# Patient Record
Sex: Female | Born: 1965 | Race: White | Hispanic: No | Marital: Married | Smoking: Never smoker
Health system: Southern US, Community
[De-identification: ages and names within clinical notes are randomized; demographics above are authoritative.]

## PROBLEM LIST (undated history)

## (undated) DIAGNOSIS — M1990 Unspecified osteoarthritis, unspecified site: Secondary | ICD-10-CM

## (undated) DIAGNOSIS — K5909 Other constipation: Secondary | ICD-10-CM

## (undated) DIAGNOSIS — N840 Polyp of corpus uteri: Secondary | ICD-10-CM

## (undated) DIAGNOSIS — N920 Excessive and frequent menstruation with regular cycle: Secondary | ICD-10-CM

---

## 1998-09-16 ENCOUNTER — Other Ambulatory Visit: Admission: RE | Admit: 1998-09-16 | Discharge: 1998-09-16 | Payer: Self-pay | Admitting: Obstetrics and Gynecology

## 1998-12-22 ENCOUNTER — Other Ambulatory Visit: Admission: RE | Admit: 1998-12-22 | Discharge: 1998-12-22 | Payer: Self-pay | Admitting: Obstetrics and Gynecology

## 2000-02-21 ENCOUNTER — Other Ambulatory Visit: Admission: RE | Admit: 2000-02-21 | Discharge: 2000-02-21 | Payer: Self-pay | Admitting: Obstetrics and Gynecology

## 2001-08-13 ENCOUNTER — Other Ambulatory Visit: Admission: RE | Admit: 2001-08-13 | Discharge: 2001-08-13 | Payer: Self-pay | Admitting: Obstetrics & Gynecology

## 2002-02-05 ENCOUNTER — Other Ambulatory Visit: Admission: RE | Admit: 2002-02-05 | Discharge: 2002-02-05 | Payer: Self-pay | Admitting: Obstetrics and Gynecology

## 2002-02-26 ENCOUNTER — Ambulatory Visit (HOSPITAL_COMMUNITY): Admission: RE | Admit: 2002-02-26 | Discharge: 2002-02-26 | Payer: Self-pay | Admitting: Obstetrics and Gynecology

## 2002-06-02 ENCOUNTER — Ambulatory Visit (HOSPITAL_COMMUNITY): Admission: RE | Admit: 2002-06-02 | Discharge: 2002-06-02 | Payer: Self-pay | Admitting: Obstetrics and Gynecology

## 2002-08-06 ENCOUNTER — Inpatient Hospital Stay (HOSPITAL_COMMUNITY): Admission: AD | Admit: 2002-08-06 | Discharge: 2002-08-07 | Payer: Self-pay | Admitting: Obstetrics and Gynecology

## 2002-09-25 ENCOUNTER — Other Ambulatory Visit: Admission: RE | Admit: 2002-09-25 | Discharge: 2002-09-25 | Payer: Self-pay | Admitting: Obstetrics and Gynecology

## 2003-12-22 ENCOUNTER — Other Ambulatory Visit: Admission: RE | Admit: 2003-12-22 | Discharge: 2003-12-22 | Payer: Self-pay | Admitting: Obstetrics and Gynecology

## 2005-08-09 ENCOUNTER — Other Ambulatory Visit: Admission: RE | Admit: 2005-08-09 | Discharge: 2005-08-09 | Payer: Self-pay | Admitting: Obstetrics and Gynecology

## 2007-10-25 ENCOUNTER — Ambulatory Visit (HOSPITAL_COMMUNITY): Admission: RE | Admit: 2007-10-25 | Discharge: 2007-10-25 | Payer: Self-pay | Admitting: Anesthesiology

## 2009-03-04 ENCOUNTER — Encounter: Admission: RE | Admit: 2009-03-04 | Discharge: 2009-03-04 | Payer: Self-pay | Admitting: Obstetrics and Gynecology

## 2010-08-23 IMAGING — MG MM DIAGNOSTIC UNILATERAL R
3 series · 3 of 3 positions shown · non-contrast
Comparison: September 24, 2007

CLINICAL DATA: Further evaluation of right asymmetry

DIGITAL DIAGNOSTIC  RIGHT  MAMMOGRAM   AND RIGHT BREAST ULTRASOUND:

[R CC (1 of 2)]
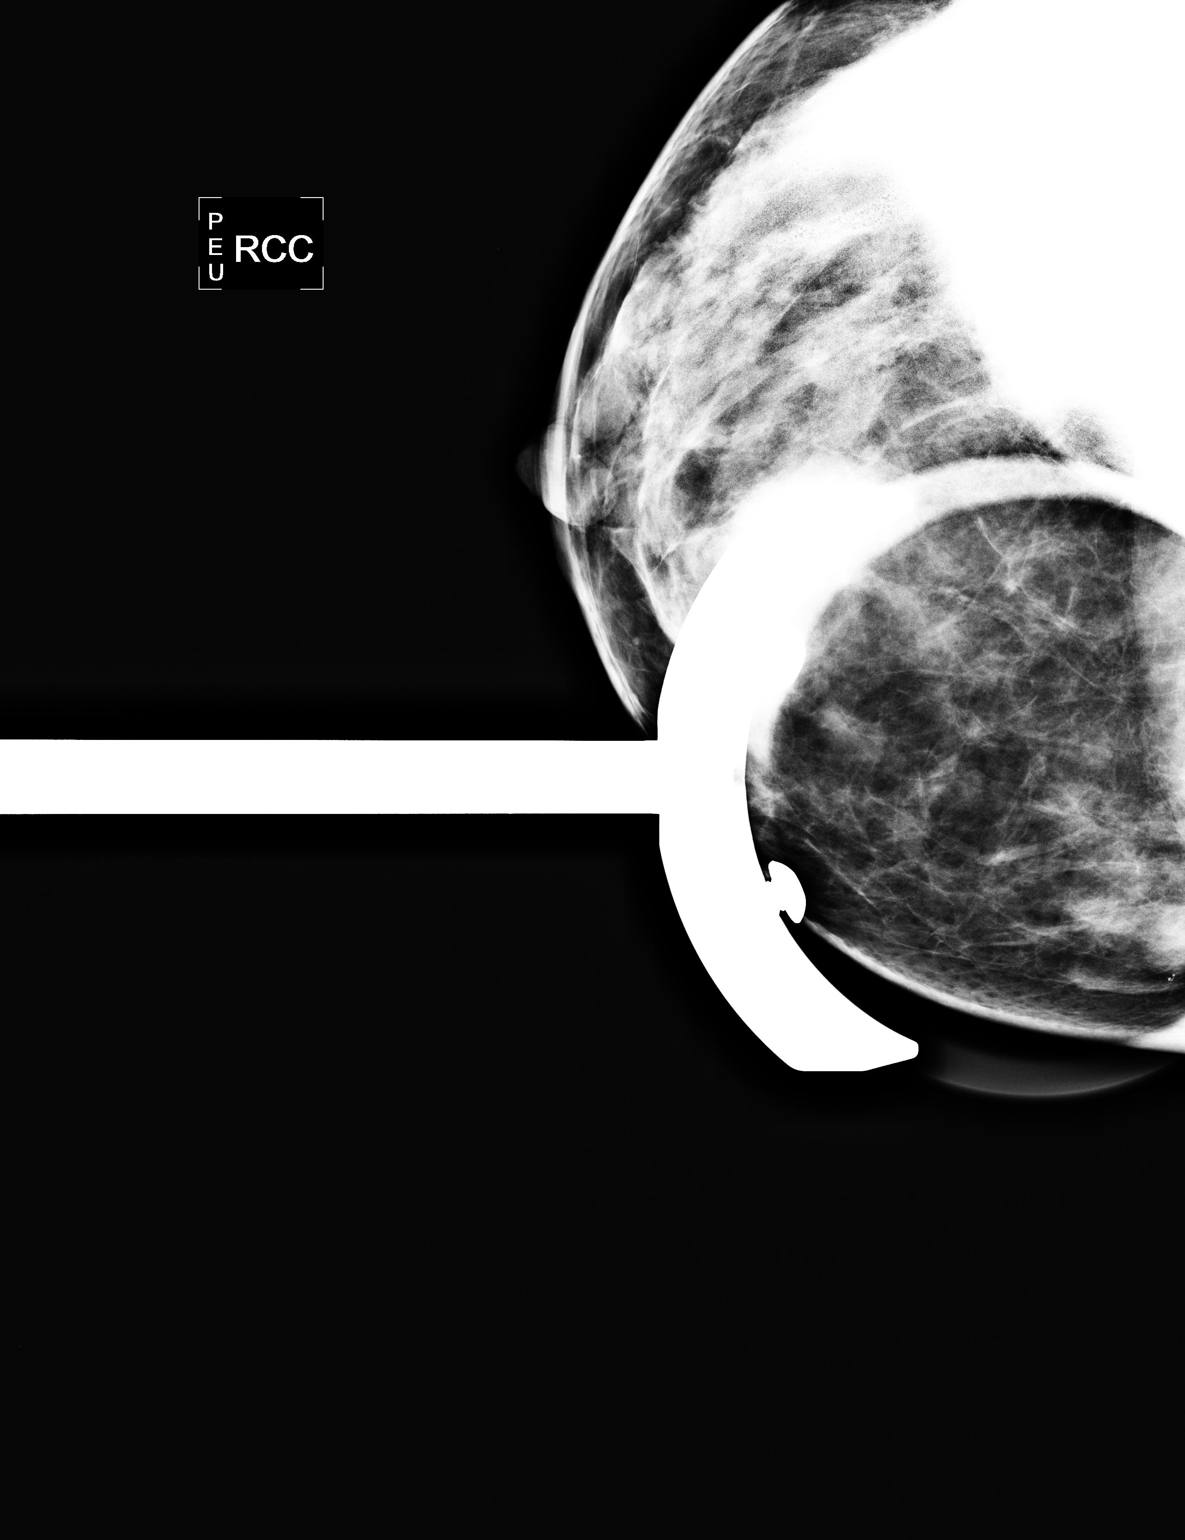

[R MLO]
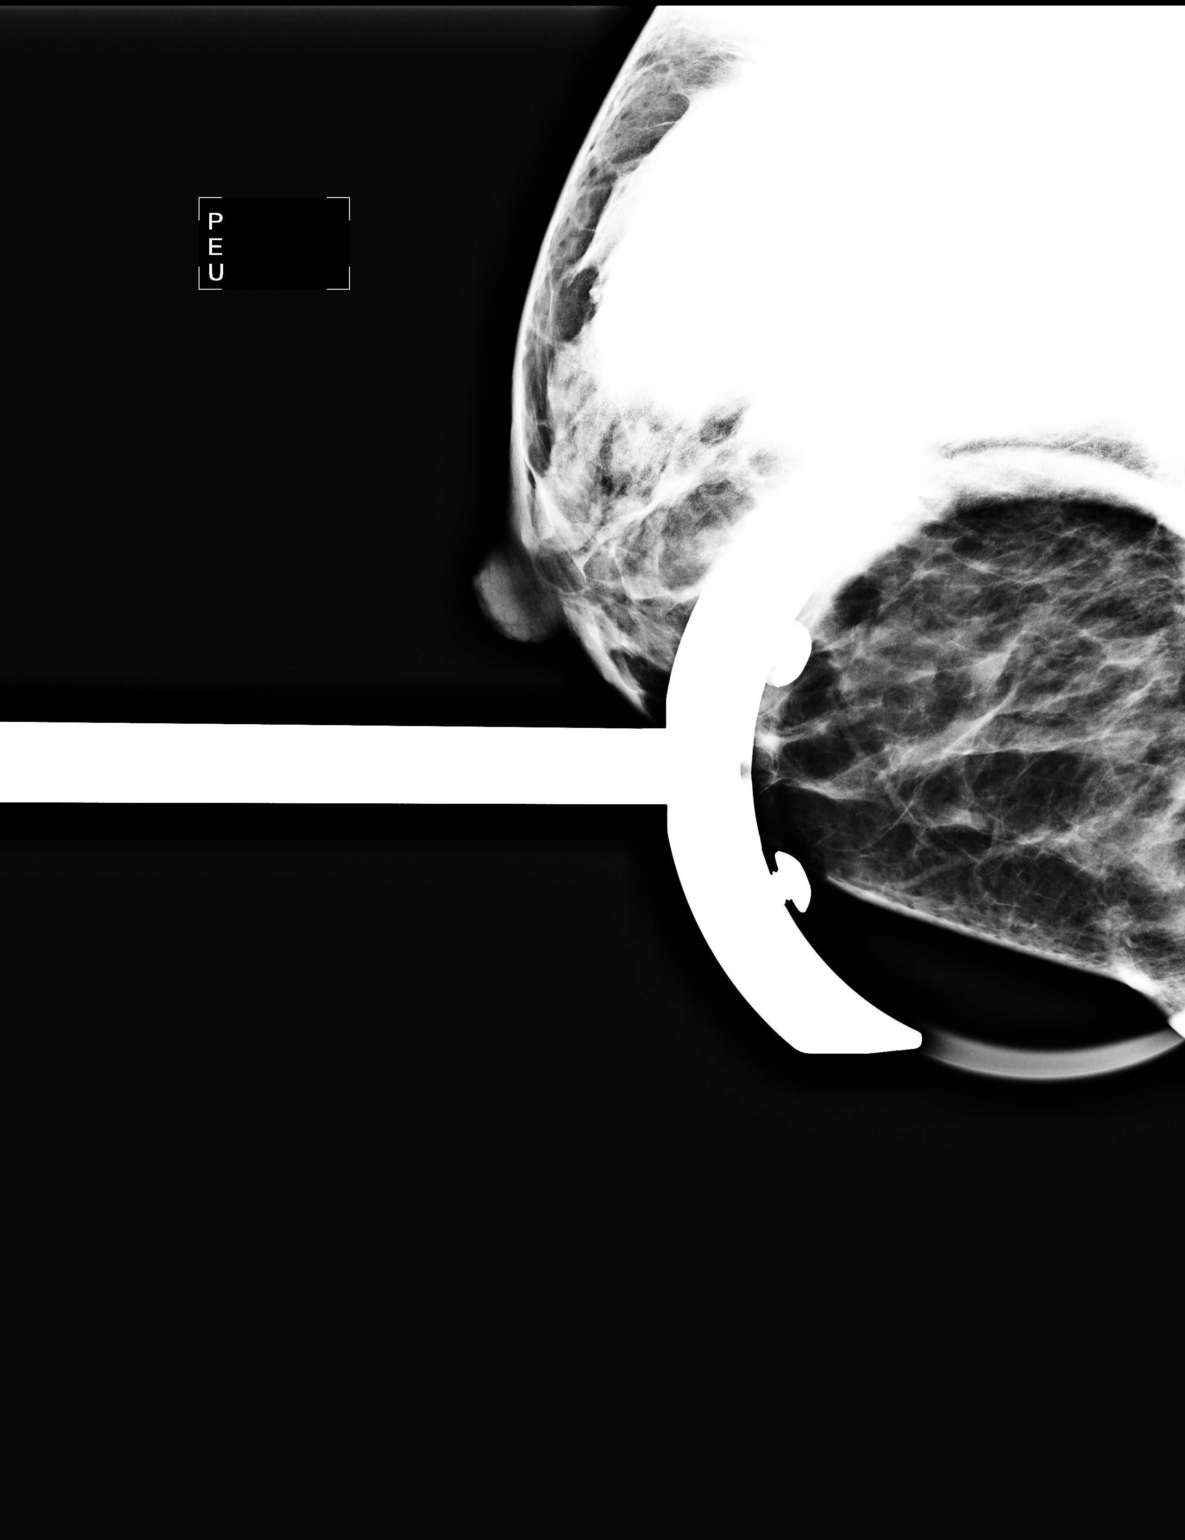

[R CC (2 of 2)]
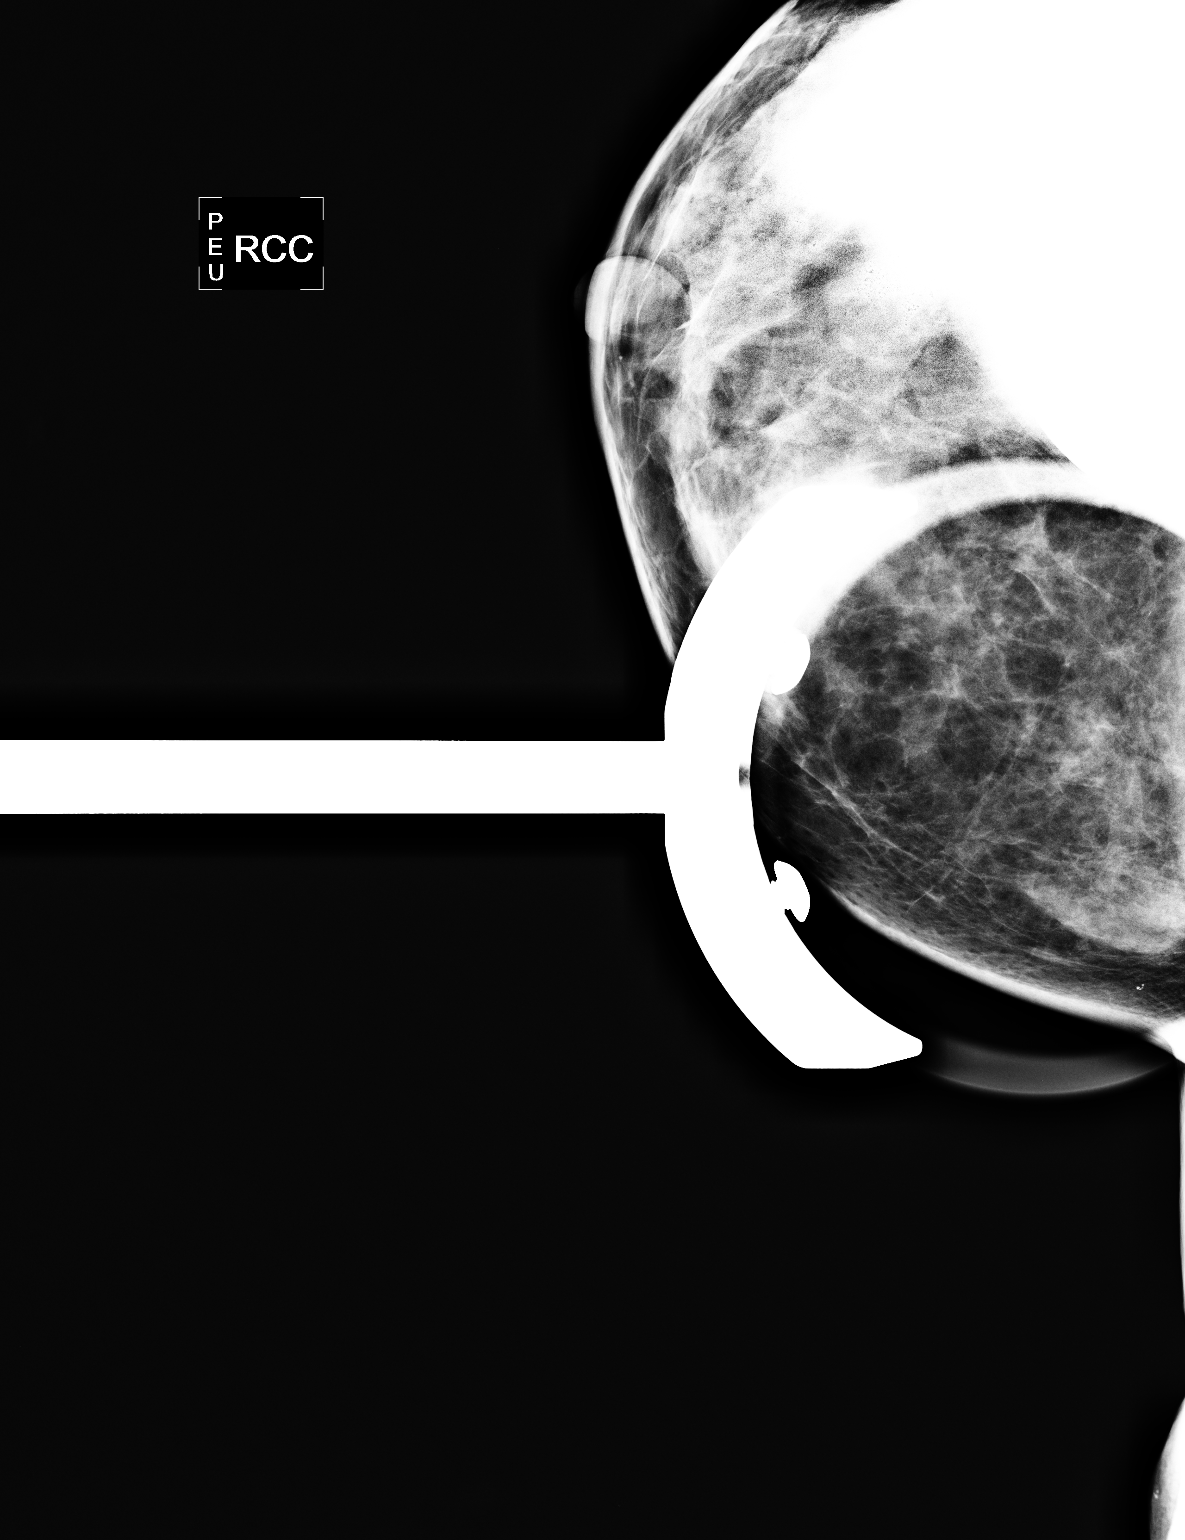

[3 of 3 positions shown; findings below may reference images not displayed]

FINDINGS: Spot compression views show that the area of the medial
inferior asymmetry appears most compatible with an island of normal
fibroglandular tissue.  It is difficult to include this far medial
tissue in its entirety on mammographic images.

On physical exam, there are no suspicious abnormalities in the
medial right breast

Ultrasound is performed, showing no abnormalities in the right
breast medially, from 12 to 6 o'clock position.  There is normal
fibroglandular tissue in this area, however.
IMPRESSION: No abnormalities.  The area in question represents a far medial
island of fibroglandular tissue.

BI-RADS CATEGORY 1:  Negative.

Recommendation:

Maintain annual screening mammography.

## 2014-06-05 ENCOUNTER — Ambulatory Visit: Payer: BLUE CROSS/BLUE SHIELD | Attending: Specialist | Admitting: Physical Therapy

## 2014-06-05 DIAGNOSIS — M7502 Adhesive capsulitis of left shoulder: Secondary | ICD-10-CM | POA: Diagnosis not present

## 2014-06-05 DIAGNOSIS — M25512 Pain in left shoulder: Secondary | ICD-10-CM | POA: Insufficient documentation

## 2014-07-28 ENCOUNTER — Ambulatory Visit: Payer: BLUE CROSS/BLUE SHIELD | Attending: Specialist | Admitting: Physical Therapy

## 2014-07-28 ENCOUNTER — Encounter: Payer: Self-pay | Admitting: Physical Therapy

## 2014-07-28 DIAGNOSIS — M7502 Adhesive capsulitis of left shoulder: Secondary | ICD-10-CM | POA: Diagnosis not present

## 2014-07-28 DIAGNOSIS — M25512 Pain in left shoulder: Secondary | ICD-10-CM | POA: Insufficient documentation

## 2014-07-28 DIAGNOSIS — M25612 Stiffness of left shoulder, not elsewhere classified: Secondary | ICD-10-CM

## 2014-07-28 NOTE — Therapy (Signed)
[facility] [address] [place], [place], [address] Phone: [phone]   Fax:  [phone]  Physical Therapy Treatment  Patient Details  Name: Caitlin Moreno MRN: [id number] Date of Birth: 1965-10-30 Referring Provider:  Eugenia Mcalpineollins, Robert, MD  Encounter Date: 07/28/2014      PT End of Session - 07/28/14 0852    Visit Number 2   PT Start Time 0800   PT Stop Time 0900   PT Time Calculation (min) 60 min      History reviewed. No pertinent past medical history.  History reviewed. No pertinent past surgical history.  There were no vitals taken for this visit.  Visit Diagnosis:  Shoulder stiffness, left  Left shoulder pain      Subjective Assessment - 07/28/14 0805    Symptoms Left shoulder pain and decreased ROM, reports that the exercises are really helping her gain ROM   Pertinent History none   Limitations Lifting   Patient Stated Goals no pain and better ROM   Currently in Pain? No/denies   Aggravating Factors  stretching really helps but is painful   Pain Relieving Factors stretches doing better          [facility] - 07/28/14 0001    AROM   Left Shoulder Flexion 135 Degrees   Left Shoulder ABduction 135 Degrees   Left Shoulder Internal Rotation 45 Degrees   Left Shoulder External Rotation 50 Degrees                  [facility] Adult PT Treatment/Exercise - 07/28/14 0001    Neck Exercises: Machines for Strengthening   UBE (Upper Arm Bike) Level 4 x 4 minutes   Cybex Row 20#   Other Machines for Strengthening Lats 20#   Shoulder Exercises: ROM/Strengthening   "W" Arms 3#   X to V Arms 3#   Ball on Wall rolling up   Manual Therapy   Passive ROM PROM of the left shoulder all motions, some contract relax                     PT Long Term Goals - 07/28/14 0853    PT LONG TERM GOAL #1   Title increase AROM of left shoulder flexion to 150, ER to 60, IR to 65 degrees   Time 6   Period Weeks   Status New   PT LONG TERM GOAL #2   Title no pain with dressing and doing hair   Time 6   Period Weeks   Status New               Plan - 07/28/14 13240852    Clinical Impression Statement Much improved from evaluation, less gaurding and pain, still some limited ROM   Pt will benefit from skilled therapeutic intervention in order to improve on the following deficits Impaired flexibility;Decreased range of motion   Rehab Potential Good   PT Frequency 1x / week   PT Duration 4 weeks   PT Treatment/Interventions Electrical Stimulation;Therapeutic exercise;Manual techniques;Patient/family education   PT Next Visit Plan may hold x 2 weeks as she has done well on own   Consulted and Agree with Plan of Care Patient        Problem List There are no active problems to display for this patient.   Jearld LeschALBRIGHT,Noach Calvillo W, PT 07/28/2014, 8:55 AM  [facility] [phone]  [place], [place], [address] Phone: [phone]   Fax:  [phone]

## 2016-04-25 ENCOUNTER — Ambulatory Visit: Payer: BLUE CROSS/BLUE SHIELD | Attending: Specialist | Admitting: Physical Therapy

## 2016-04-25 ENCOUNTER — Encounter: Payer: Self-pay | Admitting: Physical Therapy

## 2016-04-25 DIAGNOSIS — G8929 Other chronic pain: Secondary | ICD-10-CM | POA: Diagnosis present

## 2016-04-25 DIAGNOSIS — M25511 Pain in right shoulder: Secondary | ICD-10-CM | POA: Diagnosis not present

## 2016-04-25 DIAGNOSIS — M25611 Stiffness of right shoulder, not elsewhere classified: Secondary | ICD-10-CM | POA: Diagnosis present

## 2016-04-25 NOTE — Therapy (Signed)
[facility] [address] [place], [place], [address] Phone: [phone]   Fax:  [phone]  Physical Therapy Evaluation  Patient Details  Name: Caitlin PeerStacey L Moreno MRN: [id number] Date of Birth: 03-12-1966 Referring Provider: Dr. Thomasena Edisollins  Encounter Date: 04/25/2016      PT End of Session - 04/25/16 1527    Visit Number 1   Date for PT Re-Evaluation 06/20/16   PT Start Time 0245   PT Stop Time 0340   PT Time Calculation (min) 55 min   Activity Tolerance Patient limited by pain   Behavior During Therapy [facility] for tasks assessed/performed      History reviewed. No pertinent past medical history.  History reviewed. No pertinent surgical history.  There were no vitals filed for this visit.       Subjective Assessment - 04/25/16 1441    Limitations Lifting;House hold activities;Other (comment)  tennis, golf   Diagnostic tests MRI   Patient Stated Goals achieve full AROM with right arm, play tennis   Currently in Pain? Yes   Pain Score 0-No pain  9/10: worst (reaching overhead)   Pain Location Shoulder   Pain Orientation Right;Anterior;Posterior;Medial   Pain Descriptors / Indicators Radiating;Aching   Pain Type Chronic pain   Pain Onset More than a month ago   Aggravating Factors  reaching overhead, activities around the house   Pain Relieving Factors resting, after a minute of 9/10 pain, the pain subsides on its own   Effect of Pain on Daily Activities unable to use right arm effectively            [facility] - 04/25/16 0001      Assessment   Medical Diagnosis RC tear rt shoulder   Referring Provider Dr. Thomasena Edisollins   Hand Dominance Right   Next MD Visit tomorrow   Prior Therapy yes  previous episode     Balance Screen   Has the patient fallen in the past 6 months Yes     ROM / Strength   AROM / PROM / Strength AROM;PROM;Strength     AROM   AROM Assessment Site Shoulder   Right/Left  Shoulder Right   Right Shoulder Flexion 93 Degrees   Right Shoulder ABduction 60 Degrees   Right Shoulder Internal Rotation 20 Degrees   Right Shoulder External Rotation 10 Degrees     PROM   PROM Assessment Site Shoulder   Right/Left Shoulder Right   Right Shoulder Flexion 108 Degrees   Right Shoulder ABduction 70 Degrees   Right Shoulder Internal Rotation 30 Degrees   Right Shoulder External Rotation 40 Degrees     Strength   Overall Strength Comments right shoulder ER 4-/5, IR 3+/5, flexion and abduction 3/5 due to pain and inability to lift arm above 90 degrees     Palpation   Palpation comment TTP posterior shoulder region (teres minor, infraspinatus)                   [facility] Adult PT Treatment/Exercise - 04/25/16 0001      Modalities   Modalities Electrical Stimulation;Moist Heat     Moist Heat Therapy   Number Minutes Moist Heat 15 Minutes   Moist Heat Location Shoulder     Electrical Stimulation   Electrical Stimulation Location rt shoulder   Electrical Stimulation Goals Pain;Strength     Manual Therapy   Manual Therapy Joint mobilization;Passive ROM   Joint Mobilization AP jt mobilzation rt shoulder  Passive ROM flexion, abduction                PT Education - 04/25/16 1518    Education provided Yes   Education Details HEP: self shoulder mobilzation, table slide, shoulder isometrics   Person(s) Educated Patient   Methods Explanation;Demonstration;Handout;Tactile cues;Verbal cues   Comprehension Verbalized understanding;Returned demonstration          PT Short Term Goals - 04/25/16 1550      PT SHORT TERM GOAL #1   Title Pt will demonstrate proper recall of HEP.   Time 1   Period Weeks   Status New           PT Long Term Goals - 04/25/16 1550      PT LONG TERM GOAL #1   Title Pt will be able to reach 120 degrees flexion, 100 degrees of abduction, 70 degrees IR, and 50 degrees ER.   Time 8   Period Weeks   Status New      PT LONG TERM GOAL #2   Title Pt will report 50% decrease in pain with overhead activities. (currently 9/10)   Time 8   Period Weeks   Status New     PT LONG TERM GOAL #3   Title Pt will achieve 4/5 strength with Shoulder flexion and abduction.    Time 8   Period Weeks   Status New               Plan - 04/25/16 1545    Clinical Impression Statement Patient presents with limited AROM and PROM in the right shoulder due to medical diagnosis of RC tear and labral tear. Pt is able to move the arm against gravity however MMT was not perfromed due to pain with AROM and PROM. Pt states that the primary site of pain is in the posterior shoulder around the attachment sites of teres minor and infraspinatus. Pt toelrated jont mobilzation fair however she is very guarded with PROM.    Rehab Potential Fair   PT Frequency 2x / week   PT Duration 8 weeks   PT Treatment/Interventions Cryotherapy;ADLs/Self Care Home Management;Electrical Stimulation;Moist Heat;Functional mobility training;Therapeutic activities;Therapeutic exercise;Patient/family education;Passive range of motion;Manual techniques;Dry needling;Ultrasound   PT Next Visit Plan AAROM, PROM, joint mobilzation, isometrics, pain management   PT Home Exercise Plan stretches, isometrics,    Consulted and Agree with Plan of Care Patient    I left a message with the MD office secondary to the MD order reporting a complete tear and the patient reporting to us that the MD told her it was a partial tear.  Patient will benefit from skilled therapeutic intervention in order to improve the following deficits and impairments:  Decreased activity tolerance, Decreased mobility, Decreased range of motion, Decreased strength, Hypomobility, Pain, Impaired flexibility  Visit Diagnosis: Chronic right shoulder pain - Plan: PT plan of care cert/re-cert  Stiffness of right shoulder, not elsewhere classified - Plan: PT plan of care  cert/re-cert     Problem List There are no active problems to display for this patient.   Jearld LeschALBRIGHT,Danielle Lento W., PT 04/25/2016, 4:19 PM  [facility] [address] [place], [place], [address] Phone: [phone]   Fax:  [phone]  Name: Caitlin Moreno MRN: [id number] Date of Birth: 1965-06-28

## 2016-05-01 ENCOUNTER — Ambulatory Visit: Payer: BLUE CROSS/BLUE SHIELD | Admitting: Physical Therapy

## 2016-05-04 ENCOUNTER — Ambulatory Visit: Payer: BLUE CROSS/BLUE SHIELD | Attending: Specialist | Admitting: Physical Therapy

## 2016-05-04 ENCOUNTER — Encounter: Payer: Self-pay | Admitting: Physical Therapy

## 2016-05-04 DIAGNOSIS — M25511 Pain in right shoulder: Secondary | ICD-10-CM | POA: Insufficient documentation

## 2016-05-04 DIAGNOSIS — M25611 Stiffness of right shoulder, not elsewhere classified: Secondary | ICD-10-CM | POA: Insufficient documentation

## 2016-05-04 DIAGNOSIS — G8929 Other chronic pain: Secondary | ICD-10-CM | POA: Diagnosis present

## 2016-05-04 DIAGNOSIS — M62838 Other muscle spasm: Secondary | ICD-10-CM | POA: Insufficient documentation

## 2016-05-04 NOTE — Therapy (Signed)
[facility] [address] [place], [place], [address] Phone: [phone]   Fax:  [phone]  Physical Therapy Treatment  Patient Details  Name: Caitlin Moreno MRN: [id number] Date of Birth: 04-30-1966 Referring Provider: Dr. Thomasena Edisollins  Encounter Date: 05/04/2016      PT End of Session - 05/04/16 1554    Visit Number 2   Date for PT Re-Evaluation 06/20/16   PT Start Time 0245   PT Stop Time 0340   PT Time Calculation (min) 55 min   Activity Tolerance Patient tolerated treatment well   Behavior During Therapy [facility] for tasks assessed/performed      History reviewed. No pertinent past medical history.  History reviewed. No pertinent surgical history.  There were no vitals filed for this visit.      Subjective Assessment - 05/04/16 1450    Subjective Pt states she is doing "maybe a little better" however she does not feel like her AROM is any better. At worst, pain is a 7/10.   Limitations Lifting;House hold activities;Other (comment)   Diagnostic tests MRI   Currently in Pain? No/denies   Pain Location Shoulder   Pain Orientation Right   Multiple Pain Sites No            [facility] PT Assessment - 05/04/16 0001      AROM   AROM Assessment Site Shoulder   Right/Left Shoulder Right   Right Shoulder Flexion 110 Degrees   Right Shoulder ABduction 70 Degrees                     [facility] Adult PT Treatment/Exercise - 05/04/16 0001      Exercises   Exercises Shoulder     Shoulder Exercises: Stretch   Table Stretch - Flexion 5 reps;10 seconds   Table Stretch - Abduction 5 reps;10 seconds   Other Shoulder Stretches ladder climb 5x hold 10 secs   Other Shoulder Stretches wall stretches (lateral, scaption, flexion, adduction)     Modalities   Modalities Moist Heat;Electrical Stimulation     Moist Heat Therapy   Number Minutes Moist Heat 10 Minutes   Moist Heat Location Shoulder     Electrical  Stimulation   Electrical Stimulation Location rt shoulder   Electrical Stimulation Action IFC   Electrical Stimulation Goals Pain     Manual Therapy   Manual Therapy Joint mobilization;Passive ROM   Joint Mobilization AP grade 2 jt mobilization in different degrees of abduction   Passive ROM Flexion, abduction, ER, IR                PT Education - 05/04/16 1553    Education provided Yes   Education Details Educated pt on difference btw acupuncture and dry needling. Spoke with pt about continuing the HEP stretches   Person(s) Educated Patient   Methods Explanation   Comprehension Verbalized understanding          PT Short Term Goals - 05/04/16 1601      PT SHORT TERM GOAL #1   Status Achieved           PT Long Term Goals - 04/25/16 1550      PT LONG TERM GOAL #1   Title Pt will be able to reach 120 degrees flexion, 100 degrees of abduction, 70 degrees IR, and 50 degrees ER.   Time 8   Period Weeks   Status New     PT LONG TERM GOAL #  2   Title Pt will report 50% decrease in pain with overhead activities. (currently 9/10)   Time 8   Period Weeks   Status New     PT LONG TERM GOAL #3   Title Pt will achieve 4/5 strength with Shoulder flexion and abduction.    Time 8   Period Weeks   Status New               Plan - 05/04/16 1555    Clinical Impression Statement Pt toelrated treatment fair and was able to complete all exercsies. Pt does express discomfort with PROM and AROM exercises however she is able to continue and tolerate the discomfort. Range of motion was main focus today and we initiated treatment with heat, PROM and joint mobilization. Pt reports positive feedback from treatment session. Pt very persistent on dry needling and trying dry needling intervention in order to help loosen up the shoulder. Pt was educated on dry needling and was advised that if the decreased ROM was an adhesive capsulitis issue, dry needling may not help. However if  the tightness has been caused by a muscular issue or a combination of adhesive capsulitis and muscular then dry needling may be beneficial. Pt understood that dry needling is not guaranteed to help and agreed to try it. Pt has improved active flexion by 17 degrees and active abduction by 10 degrees since the initial eval.   Rehab Potential Fair   PT Frequency 2x / week   PT Duration 8 weeks   PT Treatment/Interventions Cryotherapy;ADLs/Self Care Home Management;Electrical Stimulation;Moist Heat;Functional mobility training;Therapeutic activities;Therapeutic exercise;Patient/family education;Passive range of motion;Manual techniques;Dry needling;Ultrasound   PT Next Visit Plan AAROM, PROM, joint mobilzation, isometrics, pain management, dry needling   Consulted and Agree with Plan of Care Patient      Patient will benefit from skilled therapeutic intervention in order to improve the following deficits and impairments:  Decreased activity tolerance, Decreased mobility, Decreased range of motion, Decreased strength, Hypomobility, Pain, Impaired flexibility  Visit Diagnosis: Chronic right shoulder pain  Stiffness of right shoulder, not elsewhere classified  Other muscle spasm     Problem List There are no active problems to display for this patient.   Arsenio LoaderBrian Cade Larch Waysley, SPT 05/04/2016, 4:03 PM  [facility] [address] [place], [place], [address] Phone: [phone]   Fax:  [phone]  Name: Caitlin Moreno MRN: [id number] Date of Birth: Sep 29, 1965

## 2016-05-11 ENCOUNTER — Ambulatory Visit: Payer: BLUE CROSS/BLUE SHIELD | Admitting: Rehabilitative and Restorative Service Providers"

## 2016-05-11 ENCOUNTER — Encounter: Payer: Self-pay | Admitting: Rehabilitative and Restorative Service Providers"

## 2016-05-11 DIAGNOSIS — M25511 Pain in right shoulder: Secondary | ICD-10-CM | POA: Diagnosis not present

## 2016-05-11 DIAGNOSIS — M25611 Stiffness of right shoulder, not elsewhere classified: Secondary | ICD-10-CM

## 2016-05-11 DIAGNOSIS — G8929 Other chronic pain: Secondary | ICD-10-CM

## 2016-05-11 DIAGNOSIS — M62838 Other muscle spasm: Secondary | ICD-10-CM

## 2016-05-11 NOTE — Patient Instructions (Signed)
Trigger Point Dry Needling  . What is Trigger Point Dry Needling (DN)? o DN is a physical therapy technique used to treat muscle pain and dysfunction. Specifically, DN helps deactivate muscle trigger points (muscle knots).  o A thin filiform needle is used to penetrate the skin and stimulate the underlying trigger point. The goal is for a local twitch response (LTR) to occur and for the trigger point to relax. No medication of any kind is injected during the procedure.   . What Does Trigger Point Dry Needling Feel Like?  o The procedure feels different for each individual patient. Some patients report that they do not actually feel the needle enter the skin and overall the process is not painful. Very mild bleeding may occur. However, many patients feel a deep cramping in the muscle in which the needle was inserted. This is the local twitch response.   . How Will I feel after the treatment? o Soreness is normal, and the onset of soreness may not occur for a few hours. Typically this soreness does not last longer than two days.  o Bruising is uncommon, however; ice can be used to decrease any possible bruising.  o In rare cases feeling tired or nauseous after the treatment is normal. In addition, your symptoms may get worse before they get better, this period will typically not last longer than 24 hours.   . What Can I do After My Treatment? o Increase your hydration by drinking more water for the next 24 hours. o You may place ice or heat on the areas treated that have become sore, however, do not use heat on inflamed or bruised areas. Heat often brings more relief post needling. o You can continue your regular activities, but vigorous activity is not recommended initially after the treatment for 24 hours. o DN is best combined with other physical therapy such as strengthening, stretching, and other therapies.    TENS UNIT: This is helpful for muscle pain and spasm.   Search and Purchase a  TENS 7000 2nd edition at www.tenspros.com. It should be less than $30.     TENS unit instructions: Do not shower or bathe with the unit on Turn the unit off before removing electrodes or batteries If the electrodes lose stickiness add a drop of water to the electrodes after they are disconnected from the unit and place on plastic sheet. If you continued to have difficulty, call the TENS unit company to purchase more electrodes. Do not apply lotion on the skin area prior to use. Make sure the skin is clean and dry as this will help prolong the life of the electrodes. After use, always check skin for unusual red areas, rash or other skin difficulties. If there are any skin problems, does not apply electrodes to the same area. Never remove the electrodes from the unit by pulling the wires. Do not use the TENS unit or electrodes other than as directed. Do not change electrode placement without consultating your therapist or physician. Keep 2 fingers with between each electrode.    

## 2016-05-11 NOTE — Therapy (Signed)
[facility] [place] [address]  [address] [place], [place], [address] Phone: [phone]   Fax:  [phone]  Physical Therapy Treatment  Patient Details  Name: Caitlin Moreno MRN: [id number] Date of Birth: 12-25-1965 Referring Provider: Dr. Thomasena Edisollins  Encounter Date: 05/11/2016      PT End of Session - 05/11/16 1434    Visit Number 3   Date for PT Re-Evaluation 06/20/16   PT Start Time 1434   PT Stop Time 1530   PT Time Calculation (min) 56 min   Activity Tolerance Patient tolerated treatment well      History reviewed. No pertinent past medical history.  History reviewed. No pertinent surgical history.  There were no vitals filed for this visit.      Subjective Assessment - 05/11/16 1435    Subjective (P)  Patient states that she is anxious to try the dry needling    Currently in Pain? (P)  No/denies                         [facility] Adult PT Treatment/Exercise - 05/11/16 0001      Shoulder Exercises: Standing   Other Standing Exercises shd elevation x 5     Moist Heat Therapy   Number Minutes Moist Heat 20 Minutes   Moist Heat Location Shoulder  Rt     Electrical Stimulation   Electrical Stimulation Location Rt shoulder girdle    Electrical Stimulation Action IFC   Electrical Stimulation Parameters to tolerance   Electrical Stimulation Goals Pain;Tone     Manual Therapy   Manual therapy comments Pt prone and supine    Joint Mobilization GH mobs Grade 2 - PA/AP/inferior; circumduction    Soft tissue mobilization teres/lats/lateral scapualr border; medial and inferior scapular borders; upper trap; leveator; thoracic paraspinals   Myofascial Release upper trap; scapular    Scapular Mobilization Rt scap multiple planes    Passive ROM pt supine - PROM shd flexion; abduction; ER; extension(shd off edge of table)           Trigger Point Dry Needling - 05/11/16 1532    Consent Given? Yes   Education Handout Provided Yes   Muscles Treated Upper Body Upper trapezius;Levator scapulae;Rhomboids;Supraspinatus;Infraspinatus;Subscapularis;Longissimus  Rt upper quarter    Upper Trapezius Response Twitch reponse elicited;Palpable increased muscle length   Levator Scapulae Response Twitch response elicited;Palpable increased muscle length   Rhomboids Response Twitch response elicited;Palpable increased muscle length   Supraspinatus Response Twitch response elicited;Palpable increased muscle length   Infraspinatus Response Twitch response elicited;Palpable increased muscle length   Subscapularis Response Twitch response elicited;Palpable increased muscle length   Longissimus Response Twitch response elicited;Palpable increased muscle length  thoracic              PT Education - 05/11/16 1526    Education provided Yes   Education Details TDN; TENS info    Person(s) Educated Patient   Methods Explanation;Demonstration;Tactile cues;Verbal cues;Handout   Comprehension Verbalized understanding;Returned demonstration;Verbal cues required;Tactile cues required          PT Short Term Goals - 05/04/16 1601      PT SHORT TERM GOAL #1   Status Achieved           PT Long Term Goals - 05/11/16 1435      PT LONG TERM GOAL #1   Title Pt will be able to reach 120 degrees flexion, 100 degrees of abduction, 70 degrees IR, and  50 degrees ER.   Time 8   Period Weeks   Status On-going     PT LONG TERM GOAL #2   Title Pt will report 50% decrease in pain with overhead activities. (currently 9/10)   Time 8   Period Weeks   Status On-going     PT LONG TERM GOAL #3   Title Pt will achieve 4/5 strength with Shoulder flexion and abduction.    Time 8   Period Weeks   Status On-going               Plan - 05/11/16 1534    Clinical Impression Statement Patient reports improvement with PT and HEP. She feels she has had/will have positive benefits form TDN and is interested  in continuing - even though it hurts. Patient tolerated TDN well with good improvement in tissue extensibility noted following treatment. Continued significant joint restrictions Rt GH joint; poor scapulohumeral movement patterns; limited ROM. Good improvement in ROM since initial evaluation.    Rehab Potential Fair   PT Frequency 2x / week   PT Duration 8 weeks   PT Treatment/Interventions Cryotherapy;ADLs/Self Care Home Management;Electrical Stimulation;Moist Heat;Functional mobility training;Therapeutic activities;Therapeutic exercise;Patient/family education;Passive range of motion;Manual techniques;Dry needling;Ultrasound   PT Next Visit Plan AAROM, PROM, joint mobilzation, isometrics, pain management, assess response to dry needling; consider TENS for pain management   Consulted and Agree with Plan of Care Patient      Patient will benefit from skilled therapeutic intervention in order to improve the following deficits and impairments:  Decreased activity tolerance, Decreased mobility, Decreased range of motion, Decreased strength, Hypomobility, Pain, Impaired flexibility  Visit Diagnosis: Chronic right shoulder pain  Stiffness of right shoulder, not elsewhere classified  Other muscle spasm     Problem List There are no active problems to display for this patient.   Oaklynn Stierwalt Rober MinionP Charlita Brian PT, MPH  05/11/2016, 3:38 PM  [facility] [place] [address]  [address] [place], [place], [address] Phone: [phone]   Fax:  [phone]  Name: Caitlin Moreno MRN: [id number] Date of Birth: April 15, 1966

## 2016-07-06 ENCOUNTER — Encounter (HOSPITAL_BASED_OUTPATIENT_CLINIC_OR_DEPARTMENT_OTHER): Payer: Self-pay | Admitting: *Deleted

## 2016-07-06 NOTE — Progress Notes (Signed)
NPO AFTER MN.  ARRIVE AT 0600.  NEEDS HG AND URINE PREG.  

## 2016-07-09 NOTE — H&P (Signed)
Caitlin Moreno is an 51 y.o. female with heavy menstrual bleeding and endometrial polyp.  Sonohysterogram showed 17mm endometrial polyp.  She also desires sterility.  Pertinent Gynecological History: Menses: flow is excessive with use of 7 pads or tampons on heaviest days Bleeding: dysfunctional uterine bleeding Contraception: condoms DES exposure: unknown Blood transfusions: none Sexually transmitted diseases: no past history Previous GYN Procedures: none  Last mammogram: normal Date: 06/2016 Last pap: normal Date: 06/2016 OB History: G3, P3   Menstrual History: Menarche age: n/a Patient's last menstrual period was 06/29/2016 (approximate).    Past Medical History:  Diagnosis Date  . Arthritis    lower back and neck  . Chronic constipation   . Endometrial polyp   . Heavy menstrual bleeding     History reviewed. No pertinent surgical history.  History reviewed. No pertinent family history.  Social History:  reports that she has never smoked. She has never used smokeless tobacco. She reports that she does not drink alcohol or use drugs.  Allergies:  Allergies  Allergen Reactions  . Betadine [Povidone Iodine] Other (See Comments)    "skin moddled all over body"    No prescriptions prior to admission.    ROS  Height 5' 1.5" (1.562 m), weight 119 lb (54 kg), last menstrual period 06/29/2016. Physical Exam  Constitutional: She is oriented to person, place, and time. She appears well-developed and well-nourished.  GI: Soft. There is no rebound and no guarding.  Neurological: She is alert and oriented to person, place, and time.  Skin: Skin is warm and dry.  Psychiatric: She has a normal mood and affect. Her behavior is normal.    No results found for this or any previous visit (from the past 24 hour(s)).  No results found.  Assessment/Plan: 51yo G3P3 with HMB, endometrial polyp and desire for sterility -L/S BTL, H/S, D&C, myosure, Novasure -Patient counseled re:  risk of bleeding, infection, scarring and damage to surrounding structures.  All questions were answered and the patient wishes to proceed.  Jerritt Cardoza 07/09/2016, 8:09 PM

## 2016-07-10 ENCOUNTER — Encounter (HOSPITAL_BASED_OUTPATIENT_CLINIC_OR_DEPARTMENT_OTHER): Payer: Self-pay

## 2016-07-10 ENCOUNTER — Encounter (HOSPITAL_BASED_OUTPATIENT_CLINIC_OR_DEPARTMENT_OTHER)
Admission: RE | Disposition: A | Discharge status: Home / Self Care | Payer: Self-pay | Source: Ambulatory Visit | Attending: Obstetrics & Gynecology

## 2016-07-10 ENCOUNTER — Ambulatory Visit (HOSPITAL_BASED_OUTPATIENT_CLINIC_OR_DEPARTMENT_OTHER)
Admission: RE | Admit: 2016-07-10 | Discharge: 2016-07-10 | Disposition: A | Discharge status: Home / Self Care | Payer: BLUE CROSS/BLUE SHIELD | Source: Ambulatory Visit | Attending: Obstetrics & Gynecology | Admitting: Obstetrics & Gynecology

## 2016-07-10 ENCOUNTER — Ambulatory Visit (HOSPITAL_BASED_OUTPATIENT_CLINIC_OR_DEPARTMENT_OTHER): Payer: BLUE CROSS/BLUE SHIELD | Admitting: Anesthesiology

## 2016-07-10 ENCOUNTER — Ambulatory Visit (HOSPITAL_BASED_OUTPATIENT_CLINIC_OR_DEPARTMENT_OTHER): Admit: 2016-07-10 | Payer: BLUE CROSS/BLUE SHIELD | Admitting: Obstetrics & Gynecology

## 2016-07-10 DIAGNOSIS — Z302 Encounter for sterilization: Secondary | ICD-10-CM | POA: Insufficient documentation

## 2016-07-10 DIAGNOSIS — Z888 Allergy status to other drugs, medicaments and biological substances status: Secondary | ICD-10-CM | POA: Insufficient documentation

## 2016-07-10 DIAGNOSIS — N840 Polyp of corpus uteri: Secondary | ICD-10-CM | POA: Diagnosis not present

## 2016-07-10 DIAGNOSIS — M479 Spondylosis, unspecified: Secondary | ICD-10-CM | POA: Insufficient documentation

## 2016-07-10 DIAGNOSIS — N920 Excessive and frequent menstruation with regular cycle: Secondary | ICD-10-CM | POA: Insufficient documentation

## 2016-07-10 DIAGNOSIS — K5909 Other constipation: Secondary | ICD-10-CM | POA: Diagnosis not present

## 2016-07-10 HISTORY — PX: DILITATION & CURRETTAGE/HYSTROSCOPY WITH NOVASURE ABLATION: SHX5568

## 2016-07-10 HISTORY — PX: LAPAROSCOPIC TUBAL LIGATION: SHX1937

## 2016-07-10 HISTORY — DX: Unspecified osteoarthritis, unspecified site: M19.90

## 2016-07-10 HISTORY — DX: Polyp of corpus uteri: N84.0

## 2016-07-10 HISTORY — DX: Excessive and frequent menstruation with regular cycle: N92.0

## 2016-07-10 HISTORY — DX: Other constipation: K59.09

## 2016-07-10 LAB — POCT HEMOGLOBIN-HEMACUE: Hemoglobin: 11.6 g/dL — ABNORMAL LOW (ref 12.0–15.0)

## 2016-07-10 LAB — POCT PREGNANCY, URINE: PREG TEST UR: NEGATIVE

## 2016-07-10 SURGERY — DILATATION & CURETTAGE/HYSTEROSCOPY WITH NOVASURE ABLATION
Anesthesia: General | Site: Uterus

## 2016-07-10 SURGERY — DILATATION & CURETTAGE/HYSTEROSCOPY WITH MYOSURE
Anesthesia: Choice

## 2016-07-10 MED ORDER — LACTATED RINGERS IV SOLN
INTRAVENOUS | Status: DC
  Filled 2016-07-10: qty 1000

## 2016-07-10 MED ORDER — LIDOCAINE 2% (20 MG/ML) 5 ML SYRINGE
INTRAMUSCULAR | Status: AC
  Filled 2016-07-10: qty 10

## 2016-07-10 MED ORDER — SODIUM CHLORIDE 0.9 % IJ SOLN
INTRAMUSCULAR | Status: AC
  Filled 2016-07-10: qty 50

## 2016-07-10 MED ORDER — FENTANYL CITRATE (PF) 100 MCG/2ML IJ SOLN
INTRAMUSCULAR | Status: AC
  Filled 2016-07-10: qty 2

## 2016-07-10 MED ORDER — LACTATED RINGERS IR SOLN
Status: DC | PRN
  Administered 2016-07-10: 3000 mL

## 2016-07-10 MED ORDER — LIDOCAINE 2% (20 MG/ML) 5 ML SYRINGE
INTRAMUSCULAR | Status: DC | PRN
  Administered 2016-07-10: 80 mg via INTRAVENOUS

## 2016-07-10 MED ORDER — SCOPOLAMINE 1 MG/3DAYS TD PT72
1.0000 | MEDICATED_PATCH | TRANSDERMAL | Status: DC
  Administered 2016-07-10: 1.5 mg via TRANSDERMAL
  Filled 2016-07-10: qty 1

## 2016-07-10 MED ORDER — ONDANSETRON HCL 4 MG/2ML IJ SOLN
INTRAMUSCULAR | Status: AC
  Filled 2016-07-10: qty 2

## 2016-07-10 MED ORDER — MIDAZOLAM HCL 5 MG/5ML IJ SOLN
INTRAMUSCULAR | Status: DC | PRN
  Administered 2016-07-10: 2 mg via INTRAVENOUS

## 2016-07-10 MED ORDER — MEPERIDINE HCL 25 MG/ML IJ SOLN
6.2500 mg | INTRAMUSCULAR | Status: DC | PRN
  Filled 2016-07-10: qty 1

## 2016-07-10 MED ORDER — ROCURONIUM BROMIDE 10 MG/ML (PF) SYRINGE
PREFILLED_SYRINGE | INTRAVENOUS | Status: DC | PRN
  Administered 2016-07-10: 35 mg via INTRAVENOUS

## 2016-07-10 MED ORDER — MIDAZOLAM HCL 2 MG/2ML IJ SOLN
0.5000 mg | Freq: Once | INTRAMUSCULAR | Status: DC | PRN
  Filled 2016-07-10: qty 2

## 2016-07-10 MED ORDER — KETOROLAC TROMETHAMINE 30 MG/ML IJ SOLN
INTRAMUSCULAR | Status: AC
Start: 2016-07-10 — End: 2016-07-10
  Filled 2016-07-10: qty 2

## 2016-07-10 MED ORDER — ARTIFICIAL TEARS OP OINT
TOPICAL_OINTMENT | OPHTHALMIC | Status: AC
  Filled 2016-07-10: qty 3.5

## 2016-07-10 MED ORDER — LIDOCAINE HCL 1 % IJ SOLN
INTRAMUSCULAR | Status: AC
  Filled 2016-07-10: qty 20

## 2016-07-10 MED ORDER — BUPIVACAINE HCL (PF) 0.25 % IJ SOLN
INTRAMUSCULAR | Status: DC | PRN
  Administered 2016-07-10: 2 mL

## 2016-07-10 MED ORDER — SUGAMMADEX SODIUM 200 MG/2ML IV SOLN
INTRAVENOUS | Status: AC
  Filled 2016-07-10: qty 2

## 2016-07-10 MED ORDER — ONDANSETRON HCL 4 MG/2ML IJ SOLN
INTRAMUSCULAR | Status: DC | PRN
  Administered 2016-07-10: 4 mg via INTRAVENOUS

## 2016-07-10 MED ORDER — DEXAMETHASONE SODIUM PHOSPHATE 4 MG/ML IJ SOLN
INTRAMUSCULAR | Status: DC | PRN
  Administered 2016-07-10: 10 mg via INTRAVENOUS

## 2016-07-10 MED ORDER — CEFAZOLIN SODIUM 1 G IJ SOLR
INTRAMUSCULAR | Status: AC
  Filled 2016-07-10: qty 20

## 2016-07-10 MED ORDER — ROCURONIUM BROMIDE 100 MG/10ML IV SOLN: INTRAVENOUS | Status: DC | PRN

## 2016-07-10 MED ORDER — CEFAZOLIN SODIUM-DEXTROSE 2-3 GM-% IV SOLR
INTRAVENOUS | Status: DC | PRN
  Administered 2016-07-10: 2 g via INTRAVENOUS

## 2016-07-10 MED ORDER — MIDAZOLAM HCL 2 MG/2ML IJ SOLN
INTRAMUSCULAR | Status: AC
  Filled 2016-07-10: qty 2

## 2016-07-10 MED ORDER — OXYCODONE-ACETAMINOPHEN 5-325 MG PO TABS: 1.0000 | ORAL_TABLET | ORAL | 0 refills | Status: DC | PRN

## 2016-07-10 MED ORDER — SCOPOLAMINE 1 MG/3DAYS TD PT72
MEDICATED_PATCH | TRANSDERMAL | Status: AC
  Filled 2016-07-10: qty 1

## 2016-07-10 MED ORDER — FENTANYL CITRATE (PF) 100 MCG/2ML IJ SOLN
25.0000 ug | INTRAMUSCULAR | Status: DC | PRN
  Administered 2016-07-10 (×2): 25 ug via INTRAVENOUS
  Filled 2016-07-10: qty 1

## 2016-07-10 MED ORDER — PROPOFOL 10 MG/ML IV BOLUS
INTRAVENOUS | Status: DC | PRN
  Administered 2016-07-10: 200 mg via INTRAVENOUS

## 2016-07-10 MED ORDER — PROMETHAZINE HCL 25 MG/ML IJ SOLN
6.2500 mg | INTRAMUSCULAR | Status: DC | PRN
  Filled 2016-07-10: qty 1

## 2016-07-10 MED ORDER — BUPIVACAINE HCL (PF) 0.25 % IJ SOLN
INTRAMUSCULAR | Status: AC
  Filled 2016-07-10: qty 30

## 2016-07-10 MED ORDER — FENTANYL CITRATE (PF) 100 MCG/2ML IJ SOLN
INTRAMUSCULAR | Status: DC | PRN
  Administered 2016-07-10: 25 ug via INTRAVENOUS
  Administered 2016-07-10: 50 ug via INTRAVENOUS
  Administered 2016-07-10: 25 ug via INTRAVENOUS

## 2016-07-10 MED ORDER — ROCURONIUM BROMIDE 50 MG/5ML IV SOSY
PREFILLED_SYRINGE | INTRAVENOUS | Status: AC
  Filled 2016-07-10: qty 5

## 2016-07-10 MED ORDER — LACTATED RINGERS IV SOLN
INTRAVENOUS | Status: DC
  Administered 2016-07-10 (×2): via INTRAVENOUS
  Filled 2016-07-10: qty 1000

## 2016-07-10 MED ORDER — PROPOFOL 10 MG/ML IV BOLUS
INTRAVENOUS | Status: AC
  Filled 2016-07-10: qty 40

## 2016-07-10 MED ORDER — SODIUM CHLORIDE 0.9 % IR SOLN
Status: DC | PRN
  Administered 2016-07-10: 3000 mL

## 2016-07-10 MED ORDER — LIDOCAINE HCL 4 % MT SOLN
OROMUCOSAL | Status: DC | PRN
  Administered 2016-07-10: 2 mL via TOPICAL

## 2016-07-10 MED ORDER — KETOROLAC TROMETHAMINE 30 MG/ML IJ SOLN
INTRAMUSCULAR | Status: DC | PRN
  Administered 2016-07-10: 30 mg via INTRAVENOUS

## 2016-07-10 MED ORDER — IBUPROFEN 800 MG PO TABS: 800.0000 mg | ORAL_TABLET | Freq: Four times a day (QID) | ORAL | 0 refills | Status: DC | PRN

## 2016-07-10 MED ORDER — KETOROLAC TROMETHAMINE 30 MG/ML IJ SOLN
INTRAMUSCULAR | Status: AC
  Filled 2016-07-10: qty 1

## 2016-07-10 MED ORDER — DEXAMETHASONE SODIUM PHOSPHATE 10 MG/ML IJ SOLN
INTRAMUSCULAR | Status: AC
  Filled 2016-07-10: qty 1

## 2016-07-10 MED ORDER — SUGAMMADEX SODIUM 200 MG/2ML IV SOLN
INTRAVENOUS | Status: DC | PRN
  Administered 2016-07-10: 150 mg via INTRAVENOUS
  Administered 2016-07-10: 50 mg via INTRAVENOUS

## 2016-07-10 SURGICAL SUPPLY — 61 items
ABLATOR ENDOMETRIAL BIPOLAR (ABLATOR) ×3 IMPLANT
BENZOIN TINCTURE PRP APPL 2/3 (GAUZE/BANDAGES/DRESSINGS) IMPLANT
BIPOLAR CUTTING LOOP 21FR (ELECTRODE)
BLADE SURG 11 STRL SS (BLADE) ×3 IMPLANT
CANISTER SUCTION 2500CC (MISCELLANEOUS) ×3 IMPLANT
CATH ROBINSON RED A/P 16FR (CATHETERS) ×3 IMPLANT
CHLORAPREP W/TINT 26ML (MISCELLANEOUS) ×3 IMPLANT
CLIP FILSHIE TUBAL LIGA STRL (Clip) ×3 IMPLANT
COVER BACK TABLE 60X90IN (DRAPES) ×3 IMPLANT
COVER MAYO STAND STRL (DRAPES) ×3 IMPLANT
DERMABOND ADVANCED (GAUZE/BANDAGES/DRESSINGS) ×1
DERMABOND ADVANCED .7 DNX12 (GAUZE/BANDAGES/DRESSINGS) ×2 IMPLANT
DILATOR CANAL MILEX (MISCELLANEOUS) IMPLANT
DRAPE LG THREE QUARTER DISP (DRAPES) IMPLANT
DRAPE UNDERBUTTOCKS STRL (DRAPE) ×3 IMPLANT
DRSG TEGADERM 4X4.75 (GAUZE/BANDAGES/DRESSINGS) ×3 IMPLANT
DRSG TELFA 3X8 NADH (GAUZE/BANDAGES/DRESSINGS) ×3 IMPLANT
ELECT REM PT RETURN 9FT ADLT (ELECTROSURGICAL) ×3
ELECTRODE REM PT RTRN 9FT ADLT (ELECTROSURGICAL) ×2 IMPLANT
GAUZE VASELINE 1X8 (GAUZE/BANDAGES/DRESSINGS) IMPLANT
GLOVE BIO SURGEON STRL SZ 6 (GLOVE) ×6 IMPLANT
GLOVE BIOGEL PI IND STRL 6 (GLOVE) ×2 IMPLANT
GLOVE BIOGEL PI INDICATOR 6 (GLOVE) ×1
GLOVE SURG SS PI 6.0 STRL IVOR (GLOVE) IMPLANT
GOWN STRL REUS W/ TWL LRG LVL3 (GOWN DISPOSABLE) ×4 IMPLANT
GOWN STRL REUS W/TWL LRG LVL3 (GOWN DISPOSABLE) ×2
KIT ROOM TURNOVER WOR (KITS) ×3 IMPLANT
LEGGING LITHOTOMY PAIR STRL (DRAPES) IMPLANT
LOOP CUTTING BIPOLAR 21FR (ELECTRODE) IMPLANT
NEEDLE HYPO 25X1 1.5 SAFETY (NEEDLE) ×3 IMPLANT
NEEDLE INSUFFLATION 120MM (ENDOMECHANICALS) ×3 IMPLANT
NEEDLE SPNL 22GX3.5 QUINCKE BK (NEEDLE) ×3 IMPLANT
NS IRRIG 500ML POUR BTL (IV SOLUTION) ×3 IMPLANT
PACK BASIN DAY SURGERY FS (CUSTOM PROCEDURE TRAY) ×3 IMPLANT
PACK LAPAROSCOPY II (CUSTOM PROCEDURE TRAY) ×3 IMPLANT
PAD OB MATERNITY 4.3X12.25 (PERSONAL CARE ITEMS) ×3 IMPLANT
PADDING ION DISPOSABLE (MISCELLANEOUS) ×3 IMPLANT
PENCIL BUTTON HOLSTER BLD 10FT (ELECTRODE) IMPLANT
POUCH SPECIMEN RETRIEVAL 10MM (ENDOMECHANICALS) IMPLANT
SCISSORS LAP 5X35 DISP (ENDOMECHANICALS) IMPLANT
SEALER TISSUE G2 CVD JAW 35 (ENDOMECHANICALS) IMPLANT
SEALER TISSUE G2 CVD JAW 45CM (ENDOMECHANICALS) IMPLANT
SET IRRIG TUBING LAPAROSCOPIC (IRRIGATION / IRRIGATOR) IMPLANT
SOLUTION ANTI FOG 6CC (MISCELLANEOUS) ×3 IMPLANT
SPONGE GAUZE 2X2 8PLY STRL LF (GAUZE/BANDAGES/DRESSINGS) IMPLANT
SPONGE LAP 4X18 X RAY DECT (DISPOSABLE) ×3 IMPLANT
STRIP CLOSURE SKIN 1/2X4 (GAUZE/BANDAGES/DRESSINGS) IMPLANT
STRIP CLOSURE SKIN 1/4X4 (GAUZE/BANDAGES/DRESSINGS) IMPLANT
SUT MNCRL AB 3-0 PS2 18 (SUTURE) ×3 IMPLANT
SUT VICRYL 0 UR6 27IN ABS (SUTURE) ×3 IMPLANT
SYR CONTROL 10ML LL (SYRINGE) ×6 IMPLANT
TOWEL OR 17X24 6PK STRL BLUE (TOWEL DISPOSABLE) ×12 IMPLANT
TRAY DSU PREP LF (CUSTOM PROCEDURE TRAY) ×3 IMPLANT
TROCAR XCEL NON-BLD 11X100MML (ENDOMECHANICALS) ×3 IMPLANT
TROCAR XCEL NON-BLD 5MMX100MML (ENDOMECHANICALS) ×3 IMPLANT
TUBE CONNECTING 12X1/4 (SUCTIONS) IMPLANT
TUBING AQUILEX INFLOW (TUBING) ×3 IMPLANT
TUBING AQUILEX OUTFLOW (TUBING) ×3 IMPLANT
TUBING INSUFFLATION 10FT LAP (TUBING) IMPLANT
WARMER LAPAROSCOPE (MISCELLANEOUS) ×3 IMPLANT
WATER STERILE IRR 500ML POUR (IV SOLUTION) IMPLANT

## 2016-07-10 NOTE — Transfer of Care (Signed)
  Last Vitals:  Vitals:   07/10/16 0634  BP: 108/65  Pulse: 60  Resp: 16  Temp: 36.7 C    Last Pain:  Vitals:   07/10/16 0634  TempSrc: Oral      Patients Stated Pain Goal: 8 (07/10/16 [id number])  Immediate Anesthesia Transfer of Care Note  Patient: Caitlin Moreno  Procedure(s) Performed: Procedure(s) (LRB): DILATATION & CURETTAGE/HYSTEROSCOPY WITH NOVASURE ABLATION (N/A) LAPAROSCOPIC TUBAL LIGATION (Bilateral)  Patient Location: PACU  Anesthesia Type: General  Level of Consciousness: awake, alert  and oriented  Airway & Oxygen Therapy: Patient Spontanous Breathing and Patient connected to nasal cannula oxygen  Post-op Assessment: Report given to PACU RN and Post -op Vital signs reviewed and stable  Post vital signs: Reviewed and stable  Complications: No apparent anesthesia complications

## 2016-07-10 NOTE — Anesthesia Postprocedure Evaluation (Signed)
Anesthesia Post Note  Patient: Caitlin PeerStacey L Moreno  Procedure(s) Performed: Procedure(s) (LRB): DILATATION & CURETTAGE/HYSTEROSCOPY WITH NOVASURE ABLATION (N/A) LAPAROSCOPIC TUBAL LIGATION (Bilateral)  Patient location during evaluation: PACU Anesthesia Type: General Level of consciousness: awake and alert, oriented and patient cooperative Pain management: pain level controlled Vital Signs Assessment: post-procedure vital signs reviewed and stable Respiratory status: spontaneous breathing, nonlabored ventilation and respiratory function stable Cardiovascular status: blood pressure returned to baseline and stable Postop Assessment: no signs of nausea or vomiting Anesthetic complications: no       Last Vitals:  Vitals:   07/10/16 0945 07/10/16 1000  BP: 132/78 137/73  Pulse: 70 65  Resp: 14 11  Temp:      Last Pain:  Vitals:   07/10/16 0945  TempSrc:   PainSc: 5                  Marcellene Shivley,E. Ariday Brinker

## 2016-07-10 NOTE — Anesthesia Procedure Notes (Addendum)
Procedure Name: Intubation Date/Time: 07/10/2016 7:45 AM Performed by: Annye Asa Pre-anesthesia Checklist: Patient identified, Emergency Drugs available, Suction available and Patient being monitored Patient Re-evaluated:Patient Re-evaluated prior to inductionOxygen Delivery Method: Circle system utilized Preoxygenation: Pre-oxygenation with 100% oxygen Intubation Type: IV induction Ventilation: Mask ventilation without difficulty Laryngoscope Size: Mac and 3 Grade View: Grade I Tube type: Oral Tube size: 7.0 mm Number of attempts: 1 Airway Equipment and Method: Stylet and LTA kit utilized Placement Confirmation: ETT inserted through vocal cords under direct vision,  positive ETCO2 and breath sounds checked- equal and bilateral Secured at: 21 cm Tube secured with: Tape Dental Injury: Teeth and Oropharynx as per pre-operative assessment

## 2016-07-10 NOTE — Discharge Instructions (Signed)
Call MD for T>100.4, heavy vaginal bleeding, severe abdominal pain, intractable nausea and/or vomiting, or respiratory distress.  Call office to schedule postop appointment in 2 weeks.  No driving while taking narcotics.  Pelvic rest x 6 weeks.   Post Anesthesia Home Care Instructions  Activity: Get plenty of rest for the remainder of the day. A responsible adult should stay with you for 24 hours following the procedure.  For the next 24 hours, DO NOT: -Drive a car -Advertising copywriterperate machinery -Drink alcoholic beverages -Take any medication unless instructed by your physician -Make any legal decisions or sign important papers.  Meals: Start with liquid foods such as gelatin or soup. Progress to regular foods as tolerated. Avoid greasy, spicy, heavy foods. If nausea and/or vomiting occur, drink only clear liquids until the nausea and/or vomiting subsides. Call your physician if vomiting continues.  Special Instructions/Symptoms: Your throat may feel dry or sore from the anesthesia or the breathing tube placed in your throat during surgery. If this causes discomfort, gargle with warm salt water. The discomfort should disappear within 24 hours.  If you had a scopolamine patch placed behind your ear for the management of post- operative nausea and/or vomiting:  1. The medication in the patch is effective for 72 hours, after which it should be removed.  Wrap patch in a tissue and discard in the trash. Wash hands thoroughly with soap and water. 2. You may remove the patch earlier than 72 hours if you experience unpleasant side effects which may include dry mouth, dizziness or visual disturbances. 3. Avoid touching the patch. Wash your hands with soap and water after contact with the patch.

## 2016-07-10 NOTE — Anesthesia Preprocedure Evaluation (Addendum)
Anesthesia Evaluation  Patient identified by MRN, date of birth, ID band Patient awake    Reviewed: Allergy & Precautions, NPO status , Patient's Chart, lab work & pertinent test results  History of Anesthesia Complications Negative for: history of anesthetic complications  Airway Mallampati: I  TM Distance: >3 FB Neck ROM: Full    Dental  (+) Dental Advisory Given   Pulmonary neg pulmonary ROS,    breath sounds clear to auscultation       Cardiovascular (-) anginanegative cardio ROS   Rhythm:Regular Rate:Normal     Neuro/Psych negative neurological ROS     GI/Hepatic negative GI ROS, Neg liver ROS,   Endo/Other  negative endocrine ROS  Renal/GU negative Renal ROS     Musculoskeletal   Abdominal   Peds  Hematology negative hematology ROS (+)   Anesthesia Other Findings   Reproductive/Obstetrics                             Anesthesia Physical Anesthesia Plan  ASA: I  Anesthesia Plan: General   Post-op Pain Management:    Induction: Intravenous  Airway Management Planned: Oral ETT  Additional Equipment:   Intra-op Plan:   Post-operative Plan: Extubation in OR  Informed Consent: I have reviewed the patients History and Physical, chart, labs and discussed the procedure including the risks, benefits and alternatives for the proposed anesthesia with the patient or authorized representative who has indicated his/her understanding and acceptance.   Dental advisory given  Plan Discussed with: CRNA and Surgeon  Anesthesia Plan Comments: (Plan routine monitors, GETA)        Anesthesia Quick Evaluation

## 2016-07-10 NOTE — Progress Notes (Signed)
No change to H&P.  Berlinda Farve, DO 

## 2016-07-10 NOTE — Op Note (Signed)
Caitlin PeerStacey L Staples 07/10/2016  PREOPERATIVE DIAGNOSIS:  Heavy menstrual bleeding, endometrial polyp, desires sterility  POSTOPERATIVE DIAGNOSIS:  Heavy menstrual bleeding, desires sterility  PROCEDURE:  Laparoscopic Bilateral Tubal Ligation, Hysteroscopy, D&C, NovaSure  ANESTHESIA:  General endotracheal  COMPLICATIONS:  None immediate.  ESTIMATED BLOOD LOSS:  Less than 20 ml.  INDICATIONS: 51 y.o.  with with heavy menstrual bleeding and ultrasound suspicion for endometrial polyp; desire for permanent sterility    FINDINGS:  Normal uterus, normal bilateral fallopian tubes and ovaries.  Normal internal uterine anatomy; no polyp.  TECHNIQUE:  The patient was taken to the operating room where general anesthesia was obtained without difficulty.  She was then placed in the dorsal lithotomy position and prepared and draped in sterile fashion.  After an adequate timeout was performed, a bivalved speculum was then placed in the patient's vagina, and the anterior lip of cervix grasped with the single-tooth tenaculum.  The hulka clip was advanced into the uterus.  The speculum was removed from the vagina.  Attention was then turned to the patient's abdomen where a 10-mm skin incision was made on the umbilical fold.  The Veress needle was carefully introduced into the peritoneal cavity through the abdominal wall.  Intraperitoneal placement was confirmed by drop in intraabdominal pressure with insufflation of carbon dioxide gas.  Adequate pneumoperitoneum was obtained, and the 10 mm trocar was then advanced without difficulty into the abdomen where intraabdominal placement was confirmed by the operative laparoscope.  Entirely normal pelvic anatomy was noted.  The Filshie clip applicator was advanced and the Filshie clip was placed across the entire right fallopian tube approximately 3 cm from the cornual region.  Appropriate placement was noted.  The contralateral tube was treated in the same fashion.  All  instruments were removed.  The fascial incision was closed with 0 Vicryl in a figure of eight stitch.  The skin was closed with 2-0 monocryl in a subcuticular fashion. Sponge, lap and needle counts were correct.  The uterine manipulator was removed from the vagina without complications. Tenaculum was placed on the anterior lip of the cervix.  The uterus was sounded to 9 cm.  Using Pratt cervical dilators, the cervix was dilated to accomodate the operative hysteroscope.  Using saline as distention medium, the hysteroscope was advanced and a normal endometrial cavity was noted; no polyps were seen.  On removal of the hysteroscope, the cervical length was measured 3cm.  A sharp curettage was performed with scant tissue removed until a gritty texture was felt circumferentially.  The NovaSure was opened and calibrated to fit a 6 cm cavity length.  The NovaSure was placed and seated for a 4.2 cm cavity width.  The test cycle was run and then the treatment cycle lasting 46 seconds.  The NovaSure was removed as was the tenaculum.  Hemostasis was observed.  The patient tolerated the procedure well.  Sponge, lap, and needle counts were correct times two.  The patient was then taken to the recovery room awake, extubated and in stable  in stable condition.

## 2016-07-11 ENCOUNTER — Encounter (HOSPITAL_BASED_OUTPATIENT_CLINIC_OR_DEPARTMENT_OTHER): Payer: Self-pay | Admitting: Obstetrics & Gynecology

## 2016-09-19 ENCOUNTER — Other Ambulatory Visit: Payer: Self-pay | Admitting: Plastic Surgery

## 2019-11-03 ENCOUNTER — Other Ambulatory Visit: Payer: Self-pay

## 2019-11-03 ENCOUNTER — Ambulatory Visit (INDEPENDENT_AMBULATORY_CARE_PROVIDER_SITE_OTHER): Payer: Managed Care, Other (non HMO) | Admitting: Podiatry

## 2019-11-03 ENCOUNTER — Other Ambulatory Visit: Payer: Self-pay | Admitting: Podiatry

## 2019-11-03 ENCOUNTER — Ambulatory Visit (INDEPENDENT_AMBULATORY_CARE_PROVIDER_SITE_OTHER): Payer: Managed Care, Other (non HMO)

## 2019-11-03 ENCOUNTER — Encounter: Payer: Self-pay | Admitting: Podiatry

## 2019-11-03 VITALS — Temp 98.0°F

## 2019-11-03 DIAGNOSIS — M779 Enthesopathy, unspecified: Secondary | ICD-10-CM

## 2019-11-03 DIAGNOSIS — M205X1 Other deformities of toe(s) (acquired), right foot: Secondary | ICD-10-CM

## 2019-11-03 DIAGNOSIS — M79671 Pain in right foot: Secondary | ICD-10-CM | POA: Diagnosis not present

## 2019-11-03 DIAGNOSIS — M722 Plantar fascial fibromatosis: Secondary | ICD-10-CM

## 2019-11-03 NOTE — Progress Notes (Signed)
Subjective:   Patient ID: Caitlin Moreno, female   DOB: 54 y.o.   MRN: [id number]   HPI Patient presents stating that she has had a lot of pain in her right heel and the big toe joint right stating that she is had long-term history of low-grade plantar fasciitis but this seems to have really worsened over the last 6 months.  She had 2 previous injections right along with CAM Walker right and orthotics which have not been beneficial as they are very soft and also has been on Celebrex.  Patient does not smoke likes to be active   Review of Systems  All other systems reviewed and are negative.       Objective:  Physical Exam Vitals and nursing note reviewed.  Constitutional:      Appearance: She is well-developed.  Pulmonary:     Effort: Pulmonary effort is normal.  Musculoskeletal:        General: Normal range of motion.  Skin:    General: Skin is warm.  Neurological:     Mental Status: She is alert.     Neurovascular status found to be intact muscle strength adequate range of motion within normal limits with discomfort of the plantar right heel at the insertional point tendon calcaneus and quite a bit of inflammation pain around the big toe joint right fluid buildup slight restriction of motion.  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

## 2019-11-03 NOTE — Progress Notes (Signed)
° °  Subjective:    Patient ID: Caitlin Moreno, female    DOB: 07-Aug-1965, 54 y.o.   MRN: [id number]  HPI    Review of Systems  All other systems reviewed and are negative.      Objective:   Physical Exam        Assessment & Plan:

## 2019-11-03 NOTE — Patient Instructions (Signed)

## 2019-11-26 ENCOUNTER — Ambulatory Visit: Payer: Managed Care, Other (non HMO) | Admitting: Podiatry

## 2020-10-06 ENCOUNTER — Telehealth: Payer: Self-pay | Admitting: *Deleted

## 2020-10-06 NOTE — Telephone Encounter (Signed)
Patient is ready to try Epat therapy, has had plantar fasciitis for 1 1/2 years and nothing seems to be helping.Please advise.

## 2020-10-08 NOTE — Telephone Encounter (Signed)
Please schedule with Morrie Sheldon or tanika for epat

## 2020-10-11 NOTE — Telephone Encounter (Signed)
Please schedule per Dr Charlsie Merles w/ [facility] for Epat.

## 2020-10-18 ENCOUNTER — Other Ambulatory Visit: Payer: Managed Care, Other (non HMO)

## 2021-01-21 ENCOUNTER — Other Ambulatory Visit: Payer: Managed Care, Other (non HMO)

## 2021-01-24 ENCOUNTER — Ambulatory Visit (INDEPENDENT_AMBULATORY_CARE_PROVIDER_SITE_OTHER): Payer: Managed Care, Other (non HMO)

## 2021-01-24 ENCOUNTER — Other Ambulatory Visit: Payer: Self-pay

## 2021-01-24 DIAGNOSIS — B351 Tinea unguium: Secondary | ICD-10-CM

## 2021-01-24 DIAGNOSIS — M722 Plantar fascial fibromatosis: Secondary | ICD-10-CM

## 2021-01-24 NOTE — Patient Instructions (Signed)

## 2021-01-24 NOTE — Progress Notes (Signed)
Patient presents for the 1st  EPAT treatment today with complaint of right heel pain . Diagnosed with Plantar Fasciitis by Dr. Charlsie Merles. This has been ongoing for several months. The patient has tried ice, stretching, NSAIDS and supportive shoe gear with no long term relief.   Most of the pain is located right heel .  ESWT administered and tolerated well.Treatment settings initiated at:   Energy: 15  Ended treatment session today with 3000 shocks at the following settings:   Energy: 10  Frequency: 6.0  Joules: 9.928   Reviewed post EPAT instructions. Advised to avoid ice and NSAIDs throughout the treatment process and to utilize boot or supportive shoes for at least the next 3 days.  Follow up for 2nd treatment in 1 week.

## 2021-02-03 ENCOUNTER — Other Ambulatory Visit: Payer: Managed Care, Other (non HMO)

## 2021-02-07 ENCOUNTER — Other Ambulatory Visit: Payer: Self-pay

## 2021-02-07 ENCOUNTER — Ambulatory Visit (INDEPENDENT_AMBULATORY_CARE_PROVIDER_SITE_OTHER): Payer: Self-pay | Admitting: *Deleted

## 2021-02-07 DIAGNOSIS — M722 Plantar fascial fibromatosis: Secondary | ICD-10-CM

## 2021-02-07 NOTE — Progress Notes (Signed)
Patient presents for the 2nd EPAT treatment today with complaint of right heel pain . Diagnosed with Plantar Fasciitis by Dr. Charlsie Merles. This has been ongoing for several months. The patient has tried ice, stretching, NSAIDS and supportive shoe gear with no long term relief.   Most of the pain is located plantar heel and plantar/lateral side of right foot. She states that there really hasn't been any improvement since last treatment.  ESWT administered and tolerated well.Treatment settings initiated at:   Energy: 12   Ended treatment session today with 3000 shocks at the following settings:   Energy: 5  Frequency: 7.0  Joules: 5.766  Patient was extremely tender today so decreased energy to be able to focus on areas of pain.   Reviewed post EPAT instructions. Advised to avoid ice and NSAIDs throughout the treatment process and to utilize boot or supportive shoes for at least the next 3 days.  Recommended patient wear her boot after playing tennis. I also advised to take 2 extra strength Tylenol 30 minutes prior to her next treatment.  Follow up for 3rd treatment in 1 week.

## 2021-02-14 ENCOUNTER — Other Ambulatory Visit: Payer: Self-pay

## 2021-02-14 ENCOUNTER — Ambulatory Visit (INDEPENDENT_AMBULATORY_CARE_PROVIDER_SITE_OTHER): Payer: Managed Care, Other (non HMO)

## 2021-02-14 DIAGNOSIS — B351 Tinea unguium: Secondary | ICD-10-CM

## 2021-02-14 DIAGNOSIS — M722 Plantar fascial fibromatosis: Secondary | ICD-10-CM

## 2021-02-14 NOTE — Progress Notes (Signed)
Patient presents for the 3rd EPAT treatment today with complaint of right heel pain . Diagnosed with Plantar Fasciitis by Dr. Charlsie Merles. This has been ongoing for several months. The patient has tried ice, stretching, NSAIDS and supportive shoe gear with no long term relief.   Most of the pain is located plantar heel and plantar/lateral side of right foot. She states that there really hasn't been any improvement since last treatment.  ESWT administered and tolerated well.Treatment settings initiated at:   Energy: 12   Ended treatment session today with 3000 shocks at the following settings:   Energy: 12  Frequency: 6.0  Joules: 11.77  Patient was tolerated procedure better today so was able to keep energy at a 12 but able to increase pass 12.   Reviewed post EPAT instructions. Advised to avoid ice and NSAIDs throughout the treatment process and to utilize boot or supportive shoes for at least the next 3 days.  Recommended patient wear her boot after playing tennis. I also advised to take 2 extra strength Tylenol 30 minutes prior to her next treatment.  Follow up for 4th treatment in 2 weeks.

## 2021-03-08 ENCOUNTER — Other Ambulatory Visit: Payer: Self-pay

## 2021-03-08 ENCOUNTER — Ambulatory Visit: Payer: Managed Care, Other (non HMO)

## 2021-03-08 DIAGNOSIS — M722 Plantar fascial fibromatosis: Secondary | ICD-10-CM

## 2021-03-08 NOTE — Progress Notes (Signed)
Patient presents for the 4th EPAT treatment today with complaint of right heel pain . Diagnosed with Plantar Fasciitis by Dr. Charlsie Merles. This has been ongoing for several months. The patient has tried ice, stretching, NSAIDS and supportive shoe gear with no long term relief.   Most of the pain is located plantar heel and plantar/lateral side of right foot. She states that there really hasn't been any improvement since last treatment.  ESWT administered and tolerated well.Treatment settings initiated at:   Energy: 15   Ended treatment session today with 3000 shocks at the following settings:   Energy: 15  Frequency: 6.0  Joules: 14.72  Patient tolerated procedure better today so was able to keep energy at a 15 throughout the treatment.    Reviewed post EPAT instructions. Advised to avoid ice and NSAIDs throughout the treatment process and to utilize boot or supportive shoes for at least the next 3 days.    Follow up with Dr. Charlsie Merles in 3-6 weeks

## 2023-02-07 ENCOUNTER — Other Ambulatory Visit: Payer: Self-pay | Admitting: Internal Medicine

## 2023-02-07 DIAGNOSIS — K7689 Other specified diseases of liver: Secondary | ICD-10-CM

## 2023-03-12 ENCOUNTER — Encounter: Payer: Self-pay | Admitting: Internal Medicine

## 2023-03-18 ENCOUNTER — Other Ambulatory Visit: Payer: BLUE CROSS/BLUE SHIELD

## 2023-04-12 ENCOUNTER — Ambulatory Visit (INDEPENDENT_AMBULATORY_CARE_PROVIDER_SITE_OTHER): Payer: Managed Care, Other (non HMO)

## 2023-04-12 ENCOUNTER — Ambulatory Visit: Payer: Managed Care, Other (non HMO) | Admitting: Podiatry

## 2023-04-12 ENCOUNTER — Encounter: Payer: Self-pay | Admitting: Podiatry

## 2023-04-12 DIAGNOSIS — M722 Plantar fascial fibromatosis: Secondary | ICD-10-CM

## 2023-04-12 DIAGNOSIS — M205X1 Other deformities of toe(s) (acquired), right foot: Secondary | ICD-10-CM

## 2023-04-12 DIAGNOSIS — M779 Enthesopathy, unspecified: Secondary | ICD-10-CM | POA: Diagnosis not present

## 2023-04-12 DIAGNOSIS — M7751 Other enthesopathy of right foot: Secondary | ICD-10-CM | POA: Diagnosis not present

## 2023-04-12 DIAGNOSIS — M216X1 Other acquired deformities of right foot: Secondary | ICD-10-CM | POA: Diagnosis not present

## 2023-04-12 DIAGNOSIS — M216X2 Other acquired deformities of left foot: Secondary | ICD-10-CM

## 2023-04-12 MED ORDER — TRIAMCINOLONE ACETONIDE 10 MG/ML IJ SUSP
10.0000 mg | Freq: Once | INTRAMUSCULAR | Status: AC
  Administered 2023-04-12: 10 mg via INTRA_ARTICULAR

## 2023-04-12 NOTE — Progress Notes (Signed)
Subjective:   Patient ID: Caitlin Moreno, female   DOB: 57 y.o.   MRN: [id number]   HPI Patient presents after not been seen for 3-1/2 years stating that she has had numerous treatments for her chronic Planter fasciitis and her big toe joint arthritis right.  Patient states that she has had different physical therapy she has had a red light therapy and other therapies and she continues to have problems and likes to be active but this is inhibiting her from doing it.  Patient does not smoke likes to be active   Review of Systems  All other systems reviewed and are negative.       Objective:  Physical Exam Vitals and nursing note reviewed.  Constitutional:      Appearance: She is well-developed.  Pulmonary:     Effort: Pulmonary effort is normal.  Musculoskeletal:        General: Normal range of motion.  Skin:    General: Skin is warm.  Neurological:     Mental Status: She is alert.     Neurovascular status was found to be intact muscle strength was found to be adequate range of motion adequate with patient noted to have moderate restriction of motion first MPJ right no crepitus of the joint and quite a bit of pain in the lateral and dorsal surface of the first metatarsal phalangeal joint.  Moderate discomfort of the plantar fascia right over left that she tries to keep under control with numerous conservative modalities and does have good feet inserts and hard inserts that and had been made in the past     Assessment:  Difficult condition with what appears to be some structural damage of the first MPJ right chronic Planter fasciitis that is not acute but is bothersome with activities     Plan:  H&P x-rays reviewed discussed and at this point I am focusing on the right foot and I went ahead I injected periarticular around the first MPJ 3 mg Dexasone Kenalog 5 mL Xylocaine and into the right plantar fascia 3 mg Kenalog 5 mg Xylocaine and casted for functional orthotics with reverse  Morton's extension to try to take stress off the joint surface.  All questions answered difficult problem I am hoping that this will make a big difference for her  X-rays indicate there is some spurring of the first metatarsal head right and narrowing of the joint surface on the lateral side no indication plantar spur or indications of stress fracture

## 2023-04-18 ENCOUNTER — Encounter (HOSPITAL_BASED_OUTPATIENT_CLINIC_OR_DEPARTMENT_OTHER): Payer: Self-pay

## 2023-04-18 ENCOUNTER — Emergency Department (HOSPITAL_BASED_OUTPATIENT_CLINIC_OR_DEPARTMENT_OTHER): Payer: Managed Care, Other (non HMO)

## 2023-04-18 ENCOUNTER — Other Ambulatory Visit: Payer: Self-pay

## 2023-04-18 ENCOUNTER — Emergency Department (HOSPITAL_BASED_OUTPATIENT_CLINIC_OR_DEPARTMENT_OTHER)
Admission: EM | Admit: 2023-04-18 | Discharge: 2023-04-18 | Disposition: A | Discharge status: Home / Self Care | Payer: Managed Care, Other (non HMO)

## 2023-04-18 DIAGNOSIS — W19XXXA Unspecified fall, initial encounter: Secondary | ICD-10-CM | POA: Insufficient documentation

## 2023-04-18 DIAGNOSIS — R519 Headache, unspecified: Secondary | ICD-10-CM | POA: Diagnosis not present

## 2023-04-18 DIAGNOSIS — S0101XA Laceration without foreign body of scalp, initial encounter: Secondary | ICD-10-CM | POA: Diagnosis not present

## 2023-04-18 DIAGNOSIS — S0990XA Unspecified injury of head, initial encounter: Secondary | ICD-10-CM | POA: Diagnosis present

## 2023-04-18 DIAGNOSIS — R42 Dizziness and giddiness: Secondary | ICD-10-CM | POA: Insufficient documentation

## 2023-04-18 MED ORDER — CYCLOBENZAPRINE HCL 10 MG PO TABS: 10.0000 mg | ORAL_TABLET | Freq: Two times a day (BID) | ORAL | 0 refills | Status: DC | PRN

## 2023-04-18 MED ORDER — IBUPROFEN 400 MG PO TABS
600.0000 mg | ORAL_TABLET | Freq: Once | ORAL | Status: AC
  Administered 2023-04-18: 600 mg via ORAL
  Filled 2023-04-18: qty 1

## 2023-04-18 MED ORDER — IBUPROFEN 400 MG PO TABS
ORAL_TABLET | ORAL | Status: AC
  Filled 2023-04-18: qty 1

## 2023-04-18 NOTE — ED Provider Notes (Signed)
Caitlin Moreno   CSN: [id number] Arrival date & time: 04/18/23  1451     History  Chief Complaint  Patient presents with   Head Injury    Caitlin Moreno is a 57 y.o. female.  Patient with no pertinent past medical history presents today with complaints of head injury. She states that same occurred yesterday when she was getting undressed and lost her balance and struck her head against a wall. States that she had her glasses on top of her head and her glasses penetrated the top of her head causing a wound. She does not think she lost consciousness but is unsure. She is not anticoagulated. States that since the injury she has had lightheadedness and a mental fog, headache, and a pain behind her right eye. Denies vision changes, weakness, gait abnormality, slurred speech, facial droop, weakness/sensory deficits in her upper or lower extremities. She is up to date on her tetanus. Denies any other injuries or complaints.   The history is provided by the patient. No language interpreter was used.  Head Injury Associated symptoms: headache        Home Medications Prior to Admission medications   Medication Sig Start Date End Date Taking? Authorizing Provider  celecoxib (CELEBREX) 200 MG capsule Take by mouth. Patient not taking: Reported on 04/12/2023 08/04/19   [provider]  Estradiol-Progesterone 1-100 MG CAPS Take 10 mg by mouth daily.    [provider]  progesterone (PROMETRIUM) 100 MG capsule Take 2 capsules by mouth daily.    [provider]  UNABLE TO FIND Estrogen 5 mg Lotion    [provider]  Zolpidem Tartrate (AMBIEN PO) Take 2 mg by mouth daily.    [provider]      Allergies    Betadine [povidone iodine]    Review of Systems   Review of Systems  Neurological:  Positive for headaches.  All other systems reviewed and are negative.   Physical Exam Updated  Vital Signs BP 139/82 (BP Location: Left Arm)   Pulse 73   Temp 98.2 F (36.8 C)   Resp 18   Ht 5\' 2"  (1.575 m)   Wt 54.4 kg   LMP 06/29/2016 (Approximate)   SpO2 97%   BMI 21.95 kg/m  Physical Exam Vitals and nursing Moreno reviewed.  Constitutional:      General: She is not in acute distress.    Appearance: Normal appearance. She is normal weight. She is not ill-appearing, toxic-appearing or diaphoretic.  HENT:     Head: Normocephalic and atraumatic.     Comments: No racoon eyes No battle sign  1 cm vertical superficial laceration noted to the frontal scalp. No active bleeding. No signs of foreign body Eyes:     Extraocular Movements: Extraocular movements intact.     Pupils: Pupils are equal, round, and reactive to light.  Cardiovascular:     Rate and Rhythm: Normal rate and regular rhythm.     Heart sounds: Normal heart sounds.  Pulmonary:     Effort: Pulmonary effort is normal. No respiratory distress.     Breath sounds: Normal breath sounds.  Abdominal:     General: Abdomen is flat.     Palpations: Abdomen is soft.     Tenderness: There is no abdominal tenderness.  Musculoskeletal:        General: Normal range of motion.     Cervical back: Normal, normal range of motion and  neck supple.     Thoracic back: Normal.     Lumbar back: Normal.     Comments: No midline tenderness, no stepoffs or deformity noted on palpation of cervical, thoracic, and lumbar spine  Skin:    General: Skin is warm and dry.  Neurological:     General: No focal deficit present.     Mental Status: She is alert and oriented to person, place, and time.     GCS: GCS eye subscore is 4. GCS verbal subscore is 5. GCS motor subscore is 6.     Sensory: Sensation is intact.     Motor: Motor function is intact.     Coordination: Coordination is intact.     Gait: Gait is intact.     Comments: Alert and oriented to self, place, time and event.    Speech is fluent, clear without dysarthria or  dysphasia.    Strength 5/5 in upper/lower extremities   Sensation intact in upper/lower extremities    CN I not tested  CN II grossly intact visual fields bilaterally. Did not visualize posterior eye.  CN III, IV, VI PERRLA and EOMs intact bilaterally  CN V Intact sensation to sharp and light touch to the face  CN VII facial movements symmetric  CN VIII not tested  CN IX, X no uvula deviation, symmetric rise of soft palate  CN XI 5/5 SCM and trapezius strength bilaterally  CN XII Midline tongue protrusion, symmetric L/R movements   Psychiatric:        Mood and Affect: Mood normal.        Behavior: Behavior normal.     ED Results / Procedures / Treatments   Labs (all labs ordered are listed, but only abnormal results are displayed) Labs Reviewed - No data to display  EKG None  Radiology No results found.  Procedures Procedures    Medications Ordered in ED Medications  ibuprofen (ADVIL) 400 MG tablet (has no administration in time range)  ibuprofen (ADVIL) tablet 600 mg (600 mg Oral Given 04/18/23 1817)    ED Course/ Medical Decision Making/ A&P                                 Medical Decision Making Amount and/or Complexity of Data Reviewed Radiology: ordered.  Risk Prescription drug management.   This patient is a 57 y.o. female  who presents to the ED for concern of head injury.   Differential diagnoses prior to evaluation: The emergent differential diagnosis includes, but is not limited to,  trauma . This is not an exhaustive differential.   Past Medical History / Co-morbidities / Social History:  has a past medical history of Arthritis, Chronic constipation, Endometrial polyp, and Heavy menstrual bleeding.  Additional history: Chart reviewed.  Physical Exam: Physical exam performed. The pertinent findings include: Small superficial scalp laceration with edges well adhered.  No active bleeding.  Alert and oriented and neurologically intact without  focal deficits.  Lab Tests/Imaging studies: I personally interpreted labs/imaging and the pertinent results include:  Ct head. This imaging is pending at shift change.    Medications: I ordered medication including ibuprofen for pain.  I have reviewed the patients home medicines and have made adjustments as needed.   Disposition:  Patients CT imaging is pending at shift change and will determine dispo. If negative, suspect she can be discharged home with concussion precautions. Her scalp wound has been cleaned  by staff and does not require repair. Tdap is up to date. Patient is understanding and in agreement with plan.   Care handoff to Sherian Maroon, PA-C at shift change. Please see their Moreno for continued evaluation and dispo.   Final Clinical Impression(s) / ED Diagnoses Final diagnoses:  Fall, initial encounter  Laceration of scalp without foreign body, initial encounter    Rx / DC Orders ED Discharge Orders     None         Vear Clock 04/19/23 1426    Durwin Glaze, MD 04/20/23 [phone]

## 2023-04-18 NOTE — Discharge Instructions (Addendum)
CT imaging of your head appeared normal.  You do likely have a concussion.  I recommend that you avoid bright lights, loud music, limit your screen time, and avoid all contact sports until your symptoms have completely resolved.  Please call your primary doctor to schedule close follow-up appointment.  You may take Tylenol/ibuprofen as needed for pain.  Return if development of any new or worsening symptoms.

## 2023-04-18 NOTE — ED Notes (Signed)
Wound care done, pt tolerated well.

## 2023-04-18 NOTE — ED Notes (Addendum)
Pt advised she wanted to [facility] and go home due to the prolonged nature of CT results. EDP aware and discussed AMA procedure and risks. Pt walked out of the building and her results came back. Pt received a call with her results and was dc'd by provider.

## 2023-04-18 NOTE — ED Provider Notes (Signed)
  Physical Exam  BP 139/82 (BP Location: Left Arm)   Pulse 73   Temp 98.2 F (36.8 C)   Resp 18   Ht 5\' 2"  (1.575 m)   Wt 54.4 kg   LMP 06/29/2016 (Approximate)   SpO2 97%   BMI 21.95 kg/m   Physical Exam  Procedures  Procedures  ED Course / MDM    Medical Decision Making Amount and/or Complexity of Data Reviewed Radiology: ordered.  Risk Prescription drug management.   Patient care handed off from PA-C Smoot at shift change.  See prior note for more full details.  In short, 57 year old female presents emergency department with complaints of fall and head injury.  Was attempted to get undressed when she lost her balance and fell forward hitting the top of her head on the wall.  Possible momentary LOC without blood thinner use.  Reports mental fog as well as feelings of lightheadedness.  Neuroexam nonfocal.  Patient with small abrasion to the crown of head..  CT exam pending at time of shift change.  CT negative for any acute process.  Will recommend continued local wound care at home with small abrasion on top of head.  Will recommend Tylenol/Motrin for treatment of headache as well as follow-up with primary care for reevaluation of symptoms.  Treatment plan discussed with patient and she would understanding was agreeable to said plan.  Worrisome signs and symptoms were discussed with the patient and the patient acknowledged understanding to return to the emergency department noticed.  Patient stable upon discharge.         Peter Garter, [place] 04/18/23 2025    Durwin Glaze, MD 04/19/23 [phone]

## 2023-04-18 NOTE — ED Triage Notes (Addendum)
Pt reports she fell into a wall last night when attempting to undress, states she fell forward and her head hit the wall. She had eyeglasses on her head and has a small abrasion to the top of her head on the right side. Pt reports she doesn't think she lost consciousness. Denies taking blood thinners. She is now feeling dizzy and has a headache.

## 2023-04-18 NOTE — ED Provider Triage Note (Signed)
Emergency Medicine Provider Triage Evaluation Note  Caitlin Moreno , a 57 y.o. female  was evaluated in triage.  Pt complains of head injury.  Patient reports she was getting undressed yesterday when she lost her balance striking the top of her head against a wall.  States that her glasses were on her head and she suffered an abrasion to the top of her head.  States since then, has had mental fog as well as lightheadedness.  Reports having pain behind her right eye.  Denies any visual disturbance, gait abnormality, slurred speech, facial droop, weakness/sensory deficits in her lower extremities.  Not on blood thinner.  Patient does not think that she lost consciousness but does not sure.  Denies any pain elsewhere.  Review of Systems  Positive: See above Negative:   Physical Exam  BP 139/82 (BP Location: Left Arm)   Pulse 73   Temp 98.2 F (36.8 C)   Resp 18   Ht 5\' 2"  (1.575 m)   Wt 54.4 kg   LMP 06/29/2016 (Approximate)   SpO2 97%   BMI 21.95 kg/m  Gen:   Awake, no distress   Resp:  Normal effort  MSK:   Moves extremities without difficulty  Other:  CN3-12 grossly intact.  No midline tenderness of cervical, thoracic, lumbar spine.  Right cervical paraspinal tenderness.  Medical Decision Making  Medically screening exam initiated at 3:49 PM.  Appropriate orders placed.  Caitlin Moreno was informed that the remainder of the evaluation will be completed by another provider, this initial triage assessment does not replace that evaluation, and the importance of remaining in the ED until their evaluation is complete.     Peter Garter, [place] 04/18/23 1555

## 2023-04-22 ENCOUNTER — Other Ambulatory Visit: Payer: BLUE CROSS/BLUE SHIELD

## 2023-04-23 NOTE — Progress Notes (Signed)
Orthotic order placed per Dr. Request  Reverse morton's extension MT pads and deep heel ordered  Patient will be fit upon Receipt  \Tran Arzuaga Cped, CFo, CFm

## 2023-05-17 ENCOUNTER — Ambulatory Visit
Admission: RE | Admit: 2023-05-17 | Discharge: 2023-05-17 | Disposition: A | Discharge status: Home / Self Care | Payer: Managed Care, Other (non HMO) | Source: Ambulatory Visit | Attending: Internal Medicine | Admitting: Internal Medicine

## 2023-05-17 DIAGNOSIS — K7689 Other specified diseases of liver: Secondary | ICD-10-CM

## 2023-05-17 MED ORDER — GADOPICLENOL 0.5 MMOL/ML IV SOLN
5.0000 mL | Freq: Once | INTRAVENOUS | Status: AC | PRN
  Administered 2023-05-17: 5 mL via INTRAVENOUS

## 2023-06-05 ENCOUNTER — Telehealth: Payer: Self-pay

## 2023-06-05 NOTE — Telephone Encounter (Signed)
 Spoke with patient set appt for fitting 1/15 10:45  Patient has been contacted few times phone tag back and fourth  Patient called spoke with Amil Amen and did not want to schedule with her  I called and squeezed patient in 1/15 10:45 am

## 2023-06-13 ENCOUNTER — Encounter: Payer: Self-pay | Admitting: Family Medicine

## 2023-06-13 ENCOUNTER — Ambulatory Visit: Payer: Managed Care, Other (non HMO)

## 2023-06-13 ENCOUNTER — Ambulatory Visit: Payer: Managed Care, Other (non HMO) | Admitting: Family Medicine

## 2023-06-13 VITALS — BP 122/82 | Ht 62.0 in

## 2023-06-13 DIAGNOSIS — S060X0A Concussion without loss of consciousness, initial encounter: Secondary | ICD-10-CM | POA: Diagnosis not present

## 2023-06-13 NOTE — Progress Notes (Signed)
 Patient presents today to pick up custom molded foot orthotics, diagnosed with Capsulitis, Hallux rigidus and PF by Dr. Celia Coles .   Orthotics were dispensed and fit was satisfactory. Reviewed instructions for break-in and wear. Written instructions given to patient.  Patient will follow up as needed.  Britton Cane CPed, CFo, CFm

## 2023-06-13 NOTE — Patient Instructions (Addendum)
 Thank you for coming in today.   I've referred you to [facility].  Let us  know if you don't hear from them in one week.   Check back in 2 weeks

## 2023-06-13 NOTE — Progress Notes (Signed)
 Subjective:   I, Caitlin Chroman, PhD, LAT, ATC acting as a scribe for Garlan Juniper, MD.  Chief Complaint: Caitlin Moreno,  is a 58 y.o. female who presents for initial evaluation of a head injury. Pt suffered a fall when trying to undress, falling forward, hitting her head on the floor, lost consciousness. She was seen the following day at the [facility] ED.   Today, pt c/o cont'd HA, improving since onset. Continues to have brain fog and blurry vision and dizziness.   Dx imaging: 04/18/23 Head CT  Injury date: 04/17/23 Visit #: 1  History of Present Illness:   Concussion Self-Reported Symptom Score Symptoms rated on a scale 1-6, in last 24 hours  Headache: 3   Pressure in head: 2 Neck pain: 4 Nausea or vomiting: 3 Dizziness: 5  Blurred vision: 6  Balance problems: 4 Sensitivity to light:  5 Sensitivity to noise: 5 Feeling slowed down: 6 Feeling like "in a fog": 5 "Don't feel right": 5 Difficulty concentrating: 4 Difficulty remembering: 4 Fatigue or low energy: 2 Confusion: 4 Drowsiness: 2 More emotional: 6 Irritability: 6 Sadness: 4 Nervous or anxious: 5 Trouble falling asleep: 3   Total # of Symptoms: 22/22 Total Symptom Score: 93/132  Tinnitus: Yes  Review of Systems: No fevers or chills    Review of History: Insomnia.  History of prior concussion did not take very long to improve.  Has had depression in the past but did not take medicines for very long at all.  Objective:    Physical Examination Vitals:   06/13/23 0933  BP: 122/82   MSK: Normal cervical motion Neuro: Alert and oriented intact balance.  Positive VOMS testing.  Normal coordination and strength. Psych: Normal speech and thought process.  Tearful affect at times.  No SI or HI expressed.     Imaging:  MR ABDOMEN WWO CONTRAST Result Date: 05/20/2023 CLINICAL DATA:  Reportedly abnormal calcium scoring "density of liver" EXAM: MRI ABDOMEN WITHOUT AND WITH CONTRAST  TECHNIQUE: Multiplanar multisequence MR imaging of the abdomen was performed both before and after the administration of intravenous contrast. CONTRAST:  5 mL Vueway  gadolinium contrast IV COMPARISON:  None Available. FINDINGS: Lower chest: No acute abnormality. Hepatobiliary: No solid liver abnormality is seen. Slowly filling, definitively benign hemangioma of the posterior right lobe of the liver, hepatic segment VII (series 15, image 66) additional small, definitively benign cysts or hemangiomata, for which no further follow-up or characterization is required. No gallstones, gallbladder wall thickening, or biliary dilatation. Pancreas: Unremarkable. No pancreatic ductal dilatation or surrounding inflammatory changes. Spleen: Normal in size without significant abnormality. Adrenals/Urinary Tract: Adrenal glands are unremarkable. Kidneys are normal, without renal calculi, solid lesion, or hydronephrosis. Stomach/Bowel: Stomach is within normal limits. No evidence of bowel wall thickening, distention, or inflammatory changes. Vascular/Lymphatic: No significant vascular findings are present. No enlarged abdominal lymph nodes. Other: No abdominal wall hernia or abnormality. No ascites. Musculoskeletal: No acute or significant osseous findings. IMPRESSION: Slowly filling, definitively benign hemangioma of the posterior right lobe of the liver, hepatic segment VII. Additional small, definitively benign cysts or hemangiomata. No further follow-up or characterization is required for these benign findings. Electronically Signed   By: Fredricka Jenny M.D.   On: 05/20/2023 22:48   CT Head Wo Contrast Result Date: 04/18/2023 CLINICAL DATA:  Head trauma, moderate-severe EXAM: CT HEAD WITHOUT CONTRAST TECHNIQUE: Contiguous axial images were obtained from the base of the skull through the vertex without intravenous contrast. RADIATION DOSE REDUCTION:  This exam was performed according to the departmental dose-optimization  program which includes automated exposure control, adjustment of the mA and/or kV according to patient size and/or use of iterative reconstruction technique. COMPARISON:  None Available. FINDINGS: Brain: No evidence of acute infarction, hemorrhage, hydrocephalus, extra-axial collection or mass lesion/mass effect. Vascular: No hyperdense vessel. Skull: No acute fracture. Sinuses/Orbits: Clear sinuses.  No acute orbital findings. Other: No mastoid effusions. IMPRESSION: No evidence of acute intracranial abnormality. Electronically Signed   By: Stevenson Elbe M.D.   On: 04/18/2023 20:01   DG Foot Complete Left Result Date: 04/16/2023 Please see detailed radiograph report in office note.  DG Foot Complete Right Result Date: 04/16/2023 Please see detailed radiograph report in office note.  I, Garlan Juniper, personally (independently) visualized and performed the interpretation of the CT scan images attached in this note.   Assessment and Plan   58 y.o. female with concussion following fall while dressing almost 2 months ago.  CT scan in the emergency room was unremarkable.  She continues to have significant ongoing symptoms especially vestibular and visual.  Plan for vestibular physical therapy referral and neuro-optometry referral.  Use meclizine intermittently as needed.  We could prescribe scopolamine  if needed.  Additionally we talked about mood.  Her mood has worsened recently.  Consider adding SSRI in the future if needed.  Recheck in 2 weeks.       Action/Discussion: Reviewed diagnosis, management options, expected outcomes, and the reasons for scheduled and emergent follow-up. Questions were adequately answered. Patient expressed verbal understanding and agreement with the following plan.     Patient Education: Reviewed with patient the risks (i.e, a repeat concussion, post-concussion syndrome, second-impact syndrome) of returning to play prior to complete resolution, and  thoroughly reviewed the signs and symptoms of concussion.Reviewed need for complete resolution of all symptoms, with rest AND exertion, prior to return to play. Reviewed red flags for urgent medical evaluation: worsening symptoms, nausea/vomiting, intractable headache, musculoskeletal changes, focal neurological deficits. [facility] Concussion Care Plan, which clearly outlines the plans stated above, was given to patient.   Level of service: Total encounter time 30 minutes including face-to-face time with the patient and, reviewing past medical record, and charting on the date of service.        After Visit Summary printed out and provided to patient as appropriate.  The above documentation has been reviewed and is accurate and complete Garlan Juniper

## 2023-06-19 ENCOUNTER — Telehealth: Payer: Self-pay | Admitting: Family Medicine

## 2023-06-19 ENCOUNTER — Encounter: Payer: Self-pay | Admitting: Physical Therapy

## 2023-06-19 ENCOUNTER — Ambulatory Visit: Payer: Managed Care, Other (non HMO) | Attending: Family Medicine | Admitting: Physical Therapy

## 2023-06-19 DIAGNOSIS — M542 Cervicalgia: Secondary | ICD-10-CM | POA: Diagnosis present

## 2023-06-19 DIAGNOSIS — R42 Dizziness and giddiness: Secondary | ICD-10-CM | POA: Insufficient documentation

## 2023-06-19 DIAGNOSIS — S060X0A Concussion without loss of consciousness, initial encounter: Secondary | ICD-10-CM | POA: Diagnosis not present

## 2023-06-19 NOTE — Therapy (Signed)
OUTPATIENT PHYSICAL THERAPY VESTIBULAR EVALUATION     Patient Name: LATIANA Moreno MRN: [id number] DOB:1966-02-16, 58 y.o., female Today's Date: 06/19/2023  END OF SESSION:  PT End of Session - 06/19/23 1010     Visit Number 1    Date for PT Re-Evaluation 08/17/23    Authorization Type [employer]    PT Start Time 1008    PT Stop Time 1100    PT Time Calculation (min) 52 min    Activity Tolerance Patient tolerated treatment well    Behavior During Therapy WFL for tasks assessed/performed             Past Medical History:  Diagnosis Date   Arthritis    lower back and neck   Chronic constipation    Endometrial polyp    Heavy menstrual bleeding    Past Surgical History:  Procedure Laterality Date   DILITATION & CURRETTAGE/HYSTROSCOPY WITH NOVASURE ABLATION N/A 07/10/2016   Procedure: DILATATION & CURETTAGE/HYSTEROSCOPY WITH NOVASURE ABLATION;  Surgeon: Mitchel Honour, DO;  Location: [facility];  Service: Gynecology;  Laterality: N/A;   LAPAROSCOPIC TUBAL LIGATION Bilateral 07/10/2016   Procedure: LAPAROSCOPIC TUBAL LIGATION;  Surgeon: Mitchel Honour, DO;  Location: [facility] ;  Service: Gynecology;  Laterality: Bilateral;   There are no active problems to display for this patient.   PCP: Vincente Poli, MD REFERRING PROVIDER: Denyse Amass, MD  REFERRING DIAG: concussion  THERAPY DIAG:  Dizziness and giddiness  Cervicalgia  ONSET DATE: 04/18/23  Rationale for Evaluation and Treatment: Rehabilitation  SUBJECTIVE:   SUBJECTIVE STATEMENT: Patient had a fall 04/17/23, went to the ED on 04/18/23, she hit her head probable LOC.  Reports that she has been having issues with HA, photosensitivity, dizziness, left eye twitching. Pt accompanied by: self  PERTINENT HISTORY: complaints of fall and head injury. Was attempted to get undressed when she lost her balance and fell forward hitting the top of her head on the wall. Possible momentary LOC without blood  thinner use. Reports mental fog as well as feelings of lightheadedness. CT was negative  PAIN:  Are you having pain? Yes: NPRS scale: 3/10 Pain location: neck Pain description: ache, tight, sore, spasm Aggravating factors: light, activity pain up to 6/10 Relieving factors: rest, dark room  PRECAUTIONS: None  RED FLAGS: None   WEIGHT BEARING RESTRICTIONS: No  FALLS: Has patient fallen in last 6 months? Yes. Number of falls 1  LIVING ENVIRONMENT: Lives with: lives with their family Lives in: House/apartment Stairs: Yes: Internal: 15 steps; can reach both Has following equipment at home: None  PLOF: Independent and pickle ball and tennis 5x/week, walking 3-4 miles, gym 3x/week  PATIENT GOALS: have less dizziness, return to PLOF  OBJECTIVE:  Note: Objective measures were completed at Evaluation unless otherwise noted.  DIAGNOSTIC FINDINGS: negative CT  COGNITION: Overall cognitive status: Within functional limits for tasks assessed and reports some fog with left eye twitching   POSTURE:  rounded shoulders and forward head  Looking at her eyes the left pupil is more dilated  Cervical ROM:  end range neck pain  Active A/PROM (deg) eval  Flexion Decreased 50%  Extension Decreased 25%  Right lateral flexion Decreased 50%  Left lateral flexion Decreased 50%  Right rotation Decreased 25%  Left rotation Decreased 25%  (Blank rows = not tested)  STRENGTH: WFL GAIT: Gait pattern:  slow, takes time with turns, does not like busy environments, bright areas Distance walked: 60 feet Assistive device utilized: None Level of assistance:  Complete Independence Comments: some fear  FUNCTIONAL TESTS:  Timed up and go (TUG): 26 seconds slow and careful  VESTIBULAR ASSESSMENT:  SYMPTOM BEHAVIOR:   Non-Vestibular symptoms: changes in vision, neck pain, headaches, and loss of consciousness  Type of dizziness: Imbalance (Disequilibrium), Unsteady with head/body turns,  Lightheadedness/Faint, and "Funny feeling in the head"  Frequency: daily  Duration: can last most of the day  Aggravating factors: Induced by motion: occur when walking, looking up at the ceiling, bending down to the ground, turning body quickly, turning head quickly, driving, sitting in a moving car, and activity in general  Relieving factors: head stationary, lying supine, dark room, closing eyes, rest, slow movements, and avoid busy/distracting environments  Progression of symptoms: worse  OCULOMOTOR EXAM:  Ocular Alignment: normal  Ocular ROM: No Limitations  Spontaneous Nystagmus: absent  Gaze-Induced Nystagmus: absent  Smooth Pursuits: intact, saccades, and mild with end range eye motion to the left  Saccades: dysmetria and hypometric/undershoots   VESTIBULAR - OCULAR REFLEX:   Slow VOR: Positive Left  VOR Cancellation: Unable to Maintain Gaze  Head-Impulse Test: HIT Right: negative HIT Left: negative   POSITIONAL TESTING: Left Dix-Hallpike: no nystagmus  MOTION SENSITIVITY:  Motion Sensitivity Quotient Intensity: 0 = none, 1 = Lightheaded, 2 = Mild, 3 = Moderate, 4 = Severe, 5 = Vomiting  Intensity  1. Sitting to supine 2  2. Supine to L side 3  3. Supine to R side 2  4. Supine to sitting 2  5. L Hallpike-Dix 3  6. Up from L  2  7. R Hallpike-Dix 2  8. Up from R  2  9. Sitting, head tipped to L knee 3  10. Head up from L knee 3  11. Sitting, head tipped to R knee 3  12. Head up from R knee 3  13. Sitting head turns x5 3  14.Sitting head nods x5 3  15. In stance, 180 turn to L  4  16. In stance, 180 turn to R 4   44/90 OTHOSTATICS: not done                                                                                                                           TREATMENT DATE:  06/19/23 HEP   PATIENT EDUCATION: Education details: POC/HEP Person educated: Patient Education method: Programmer, multimedia, Facilities manager, Verbal cues, and Handouts Education  comprehension: verbalized understanding  HOME EXERCISE PROGRAM: Access Code: [email or web address] URL: [email or web address] Date: 06/19/2023 Prepared by: Stacie Glaze  Exercises - Seated Gaze Stabilization with Head Rotation  - 2 x daily - 7 x weekly - 1 sets - 10 reps - 1 hold - Imaginary Target with Head Nod  - 2 x daily - 7 x weekly - 1 sets - 10 reps - 1 hold - Seated VOR Cancellation  - 2 x daily - 7 x weekly - 1 sets - 10 reps - 1 hold - Standing Gaze Stabilization with Two Near Targets and Head  Rotation  - 2 x daily - 7 x weekly - 1 sets - 10 reps - 1 hold  GOALS: Goals reviewed with patient? Yes  SHORT TERM GOALS: Target date: 07/03/23  Independent with initial HEP Goal status: INITIAL  LONG TERM GOALS: Target date: 08/17/23  Independent with advanced HEP Baseline:  Goal status: INITIAL  2.  Report 50% less dizziness Baseline: dizzy all of the time Goal status: INITIAL  3.  Decrease TUG time to 12 seconds Baseline: 26 seconds Goal status: INITIAL  4.  Report 50% less issues with light Baseline: cannot tolerate the fluorescent light in the building, needed dark room with light off Goal status: INITIAL  5.  Decrease motion sensitivity quotient to 22 Baseline: 44 Goal status: INITIAL  ASSESSMENT:  CLINICAL IMPRESSION: Patient is a 58 y.o. female who was seen today for physical therapy evaluation and treatment for concussion, with dizziness, poor tolerance of activity and neck pain and HA.  She does report increased dizziness and nausea with busy environments, bright lights and with lines on floor.  Had trouble with VOR activities.  Negative dix halpike  OBJECTIVE IMPAIRMENTS: cardiopulmonary status limiting activity, decreased activity tolerance, decreased balance, decreased cognition, decreased coordination, decreased endurance, decreased mobility, difficulty walking, decreased ROM, decreased strength, increased muscle spasms, impaired flexibility, impaired  vision/preception, improper body mechanics, postural dysfunction, and pain.   REHAB POTENTIAL: Good  CLINICAL DECISION MAKING: Stable/uncomplicated  EVALUATION COMPLEXITY: Low   PLAN:  PT FREQUENCY: 1x/week  PT DURATION: 12 weeks  PLANNED INTERVENTIONS: 97164- PT Re-evaluation, 97110-Therapeutic exercises, 97530- Therapeutic activity, [id number]- Neuromuscular re-education, 97535- Self Care, 91478- Manual therapy, [id number]- Gait training, 97014- Electrical stimulation (unattended), 97035- Ultrasound, 29562- Traction (mechanical), Patient/Family education, Balance training, Stair training, Dry Needling, Joint mobilization, Vestibular training, Visual/preceptual remediation/compensation, Cryotherapy, and Moist heat  PLAN FOR NEXT SESSION: continue VOR, work on movements    [employer], PT 06/19/2023, 10:11 AM

## 2023-06-19 NOTE — Telephone Encounter (Signed)
Called and left VM w/ Dr. Su Hilt' office number and advised to call and schedule.

## 2023-06-19 NOTE — Telephone Encounter (Signed)
Patient called to follow up on the referral for a neuro optometrist? I gave her information for Dr Dory Larsen but she said that there were three different ones that Dr Denyse Amass mentioned?  I did not see a referral.  Can you help?

## 2023-06-19 NOTE — Telephone Encounter (Signed)
The best way to get an appointment with Dr. Su Hilt is to call him.  I believe we provided her with contact information for his office.  She should call.

## 2023-06-29 NOTE — Progress Notes (Deleted)
 Subjective:   I, Philbert Riser, PhD, LAT, ATC acting as a scribe for Clementeen Graham, MD.  Chief Complaint: Caitlin Moreno,  is a 58 y.o. female who presents for f/u concussion. Pt suffered a fall when trying to undress, falling forward, hitting her head on the floor. Pt was last seen by Dr. Denyse Amass on 06/13/23 and was referred to vestibular therapy, completing 1 visit. Also referred to neuro-optometry.  Today, pt reports ***   Dx imaging: 04/18/23 Head CT   Injury date: 04/17/23 Visit #: 2  History of Present Illness:   Concussion Self-Reported Symptom Score Symptoms rated on a scale 1-6, in last 24 hours  Headache: ***   Pressure in head: *** Neck pain: *** Nausea or vomiting: *** Dizziness: ***  Blurred vision: ***  Balance problems: *** Sensitivity to light:  *** Sensitivity to noise: *** Feeling slowed down: *** Feeling like "in a fog": *** "Don't feel right": *** Difficulty concentrating: *** Difficulty remembering: *** Fatigue or low energy: *** Confusion: *** Drowsiness: *** More emotional: *** Irritability: *** Sadness: *** Nervous or anxious: *** Trouble falling asleep: ***   Total # of Symptoms: ***/22 Total Symptom Score: ***/132  Previous Total # of Symptoms: 22/22 Previous Symptom Score: 93/132  Tinnitus: Yes/No***  Review of Systems:  ***    Review of History: ***  Objective:    Physical Examination There were no vitals filed for this visit. MSK:  *** Neuro: *** Psych: ***     Imaging:  ***  Assessment and Plan   58 y.o. female with ***    ***    Action/Discussion: Reviewed diagnosis, management options, expected outcomes, and the reasons for scheduled and emergent follow-up. Questions were adequately answered. Patient expressed verbal understanding and agreement with the following plan.     Patient Education: Reviewed with patient the risks (i.e, a repeat concussion, post-concussion syndrome, second-impact syndrome) of  returning to play prior to complete resolution, and thoroughly reviewed the signs and symptoms of concussion.Reviewed need for complete resolution of all symptoms, with rest AND exertion, prior to return to play. Reviewed red flags for urgent medical evaluation: worsening symptoms, nausea/vomiting, intractable headache, musculoskeletal changes, focal neurological deficits. [facility] Concussion Care Plan, which clearly outlines the plans stated above, was given to patient.   Level of service: ***     After Visit Summary printed out and provided to patient as appropriate.  The above documentation has been reviewed and is accurate and complete Adron Bene

## 2023-07-02 ENCOUNTER — Telehealth: Payer: Self-pay | Admitting: Family Medicine

## 2023-07-02 ENCOUNTER — Encounter: Payer: Managed Care, Other (non HMO) | Admitting: Family Medicine

## 2023-07-02 NOTE — Telephone Encounter (Signed)
I spoke with the folks over at the [place] long hyperbaric oxygen therapy location and they do not do hyperbaric oxygen for concussion.  Possibly we can get it done at [facility] in [place] or in [place].  Personally I do not think there is very good evidence for hyperbaric oxygen for concussion.  I do not know about cryotherapy for concussion.  I doubt that it would help very much but it may be worth a try.  I am not optimistic that insurance would cover either 1 of these interventions.  Please let me know if you would like me to pursue this further.

## 2023-07-02 NOTE — Telephone Encounter (Signed)
Pt had to cancel today's appt as she was dizzy, she just flew back from [place] last night.   Pt would like to be referred to the hyperbaric chamber at [facility] for treatment of her concussion. She has talked with someone recently that said this helped her with her concussion. Also wonders if we have every used cryo therapy for concussion treatment.

## 2023-07-04 ENCOUNTER — Ambulatory Visit: Payer: Managed Care, Other (non HMO) | Admitting: Physical Therapy

## 2023-07-11 ENCOUNTER — Ambulatory Visit: Payer: Managed Care, Other (non HMO) | Admitting: Physical Therapy

## 2023-07-18 ENCOUNTER — Ambulatory Visit: Payer: Managed Care, Other (non HMO) | Admitting: Physical Therapy

## 2023-07-20 ENCOUNTER — Telehealth: Payer: Self-pay | Admitting: Family Medicine

## 2023-07-20 DIAGNOSIS — S060X0A Concussion without loss of consciousness, initial encounter: Secondary | ICD-10-CM

## 2023-07-20 NOTE — Telephone Encounter (Signed)
 Called and spoke with patient. She would like referral to the location in [place].

## 2023-07-20 NOTE — Telephone Encounter (Signed)
 Patient called stating that her therapist at [facility] mentioned trying a hyperbaric chamber for her concussion.  She asked Dr Denyse Amass thoughts and would like to know if he could send an order for it? She said that she thinks they have one at [facility].  Please advise.

## 2023-07-20 NOTE — Telephone Encounter (Signed)
 Hi Wadie,  Per Dr. Denyse Amass:   I spoke with the folks over at the [facility] long hyperbaric oxygen therapy location and they do not do hyperbaric oxygen for concussion.  Possibly we can get it done at [facility] in [place] or in [place].  Personally I do not think there is very good evidence for hyperbaric oxygen for concussion.   I do not know about cryotherapy for concussion.  I doubt that it would help very much but it may be worth a try.  I am not optimistic that insurance would cover either 1 of these interventions.   Please let me know if you would like me to pursue this further.   Gearldine Bienenstock, CMA

## 2023-07-23 NOTE — Telephone Encounter (Signed)
 Spoke with patient and discussed referral options. Pt is agreeable to being referral to [facility].  [facility] [address], [address], [place], [place] [address] Phone: [phone] Fax: [phone]  Referral has been placed.

## 2023-07-23 NOTE — Addendum Note (Signed)
 Addended by: Dierdre Searles on: 07/23/2023 12:52 PM   Modules accepted: Orders

## 2023-07-23 NOTE — Telephone Encounter (Signed)
 Patient called back to discuss further. Can you call the patient back?

## 2023-07-25 ENCOUNTER — Ambulatory Visit: Payer: Managed Care, Other (non HMO) | Admitting: Physical Therapy

## 2023-08-21 ENCOUNTER — Telehealth: Payer: Self-pay | Admitting: Family Medicine

## 2023-08-21 NOTE — Telephone Encounter (Signed)
 Called and spoke with patient, she states that she got a little from [place], [place], she thinks that this is will sufficient.

## 2023-08-21 NOTE — Telephone Encounter (Signed)
 Pt would like a letter to be excused from upcoming Jury Duty due to her concussion symptoms.  [facility]  Juror # [id number] Date 09/17/2023  Pt to retrieve from MyChart.

## 2024-03-05 ENCOUNTER — Ambulatory Visit: Admitting: Obstetrics and Gynecology

## 2024-03-05 ENCOUNTER — Encounter: Payer: Self-pay | Admitting: Obstetrics and Gynecology

## 2024-03-05 VITALS — BP 119/74 | HR 60 | Ht 61.2 in | Wt 130.4 lb

## 2024-03-05 DIAGNOSIS — K5904 Chronic idiopathic constipation: Secondary | ICD-10-CM | POA: Insufficient documentation

## 2024-03-05 DIAGNOSIS — N393 Stress incontinence (female) (male): Secondary | ICD-10-CM | POA: Insufficient documentation

## 2024-03-05 DIAGNOSIS — N816 Rectocele: Secondary | ICD-10-CM | POA: Insufficient documentation

## 2024-03-05 DIAGNOSIS — N3281 Overactive bladder: Secondary | ICD-10-CM | POA: Insufficient documentation

## 2024-03-05 NOTE — Assessment & Plan Note (Signed)
-   For treatment of stress urinary incontinence,  non-surgical options include expectant management, weight loss, physical therapy, as well as a pessary.  Surgical options include a midurethral sling, Burch urethropexy, and transurethral injection of a bulking agent. - She prefers urethral bulking. We discussed success rate of approximately 70-80% and possible need for second injection. We reviewed that this is not a permanent procedure and the Bulkamid does become less effective over time. Risks reviewed including injury to bladder or urethra, UTI, urinary retention and hematuria.  - Will have her undergo urodynamic testing prior to procedure to demonstrate leakage due to mixed incontinence symptoms

## 2024-03-05 NOTE — Assessment & Plan Note (Addendum)
-   For constipation, we reviewed the importance of a better bowel regimen.  We also discussed the importance of avoiding chronic straining, as it can exacerbate her pelvic floor symptom/  -  We discussed initiating therapy with increasing fluid intake, fiber supplementation, stool softeners, and laxatives such as miralax daily.

## 2024-03-05 NOTE — Assessment & Plan Note (Addendum)
-   We discussed the symptoms of overactive bladder (OAB), which include urinary urgency, urinary frequency, nocturia, with or without urge incontinence.  While we do not know the exact etiology of OAB, several treatment options exist. We discussed management including behavioral therapy (decreasing bladder irritants, urge suppression strategies, timed voids, bladder retraining), physical therapy, medication; for refractory cases posterior tibial nerve stimulation, sacral neuromodulation, and intravesical botulinum toxin injection.  - She prefers to avoid medication and is interested in intravesical botox. Reviewed the risks, benefits and alternatives of treatment including but not limited to infection, need for self-catheterization and need for repeat therapy.  We discussed that there is a 5-15% chance of needing to catheterize with Botox and that this usually resolves in a few months; however can persist for longer periods of time.  Typically Botox injections would need to be repeated every 3-12 months since this is not a permanent therapy.  - Will attempt insurance approval for botox

## 2024-03-05 NOTE — Progress Notes (Signed)
 New Patient Evaluation and Consultation  Referring Provider: Mat Browning, MD PCP: Mat Browning, MD Date of Service: 03/05/2024  SUBJECTIVE Chief Complaint: New Patient (Initial Visit) Caitlin Moreno is a 58 y.o. female is here for stress incontinence and urgency.)  History of Present Illness: Caitlin Moreno is a 58 y.o. White or Caucasian female seen in consultation at the request of Dr Mat for evaluation of incontinence.    Urinary Symptoms: Leaks urine with cough/ sneeze, exercise, lifting, with a full bladder, with movement to the bathroom, with urgency, without sensation, and continuously Leaks 3 time(s) per day. UUI = SUI Pad use: none She started wearing a tampon when she plays tennis Patient is bothered by UI symptoms. Has not had any prior treatment  Day time voids 12.  Nocturia: 3 times per night to void. Voiding dysfunction:  does not empty bladder well.  Patient does not use a catheter to empty bladder.  When urinating, patient feels the need to urinate multiple times in a row and to push on her belly or vagina to empty bladder Drinks: 2 cups coffee in AM, about 20oz water or flavored water (la croix) per day  UTIs: 0 UTI's in the last year.   Reports history of blood in urine   Pelvic Organ Prolapse Symptoms:                  Patient Admits to a feeling of a bulge the vaginal area.  Patient Denies seeing a bulge.  This bulge is not bothersome.  Bowel Symptom: Bowel movements: every other day Stool consistency: hard Straining: yes.  Splinting: yes.  Incomplete evacuation: yes.  Patient Admits to accidental bowel leakage / fecal incontinence  Occurs: rarely- hard pellet  Consistency with leakage: solid or soft  Bowel regimen: diet, fiber, and magnesium, probiotic Last colonoscopy: 6 months ago   Sexual Function Sexually active: yes.  Sexual orientation: heterosexual Pain with sex: No  Pelvic Pain Denies pelvic pain    Past Medical  History:  Past Medical History:  Diagnosis Date   Arthritis    lower back and neck   Chronic constipation    Endometrial polyp    Heavy menstrual bleeding      Past Surgical History:   Past Surgical History:  Procedure Laterality Date   DILITATION & CURRETTAGE/HYSTROSCOPY WITH NOVASURE ABLATION N/A 07/10/2016   Procedure: DILATATION & CURETTAGE/HYSTEROSCOPY WITH NOVASURE ABLATION;  Surgeon: Duwaine Blumenthal, DO;  Location: [facility];  Service: Gynecology;  Laterality: N/A;   LAPAROSCOPIC TUBAL LIGATION Bilateral 07/10/2016   Procedure: LAPAROSCOPIC TUBAL LIGATION;  Surgeon: Duwaine Blumenthal, DO;  Location: [facility];  Service: Gynecology;  Laterality: Bilateral;     Past OB/GYN History: OB History  Gravida Para Term Preterm AB Living  3 3 3   3   SAB IAB Ectopic Multiple Live Births          # Outcome Date GA Lbr Len/2nd Weight Sex Type Anes PTL Lv  3 Term      Vag-Spont     2 Term      Vag-Vacuum     1 Term      Vag-Spont       Menopausal: Yes, at age 85, Denies vaginal bleeding since menopause Last pap smear was 3 months ago- neg  Medications: Patient has a current medication list which includes the following prescription(s): estradiol-progesterone, progesterone, UNABLE TO FIND, and zolpidem tartrate.   Allergies: Patient is allergic to betadine [povidone  iodine].   Social History:  Social History   Tobacco Use   Smoking status: Never   Smokeless tobacco: Never  Vaping Use   Vaping status: Never Used  Substance Use Topics   Alcohol use: Yes    Comment: Socially   Drug use: No    Relationship status: married Patient lives with her husband.   Patient is not employed. Regular exercise: Yes: plays tennis History of abuse: No  Family History:   Family History  Problem Relation Age of Onset   Breast cancer Mother    Heart attack Mother    Hyperlipidemia Father    Diabetes Father    Parkinson's disease Maternal Grandmother     Cancer - Colon Paternal Grandmother      Review of Systems: Review of Systems  Constitutional:  Positive for malaise/fatigue. Negative for fever and weight loss.  Respiratory:  Negative for cough, shortness of breath and wheezing.   Cardiovascular:  Negative for chest pain, palpitations and leg swelling.  Gastrointestinal:  Negative for abdominal pain and blood in stool.  Genitourinary:  Negative for dysuria.  Musculoskeletal:  Negative for myalgias.  Skin:  Negative for rash.  Neurological:  Negative for dizziness and headaches.  Endo/Heme/Allergies:  Does not bruise/bleed easily.       + hot flashes  Psychiatric/Behavioral:  Negative for depression. The patient is not nervous/anxious.      OBJECTIVE Physical Exam: Vitals:   03/05/24 1313  BP: 119/74  Pulse: 60  Weight: 130 lb 6.4 oz (59.1 kg)  Height: 5' 1.2 (1.554 m)    Physical Exam Vitals reviewed. Exam conducted with a chaperone present.  Constitutional:      General: She is not in acute distress. Pulmonary:     Effort: Pulmonary effort is normal.  Abdominal:     General: There is no distension.     Palpations: Abdomen is soft.     Tenderness: There is no abdominal tenderness. There is no rebound.  Musculoskeletal:        General: No swelling. Normal range of motion.  Skin:    General: Skin is warm and dry.     Findings: No rash.  Neurological:     Mental Status: She is alert and oriented to person, place, and time.  Psychiatric:        Mood and Affect: Mood normal.        Behavior: Behavior normal.      GU / Detailed Urogynecologic Evaluation:  Pelvic Exam: Normal external female genitalia; Bartholin's and Skene's glands normal in appearance; urethral meatus normal in appearance, no urethral masses or discharge.   CST: negative  Speculum exam reveals normal vaginal mucosa with atrophy. Cervix normal appearance. Uterus normal single, nontender. Adnexa no mass, fullness, tenderness.     Pelvic floor  strength I/V, puborectalis III/V external anal sphincter IV/V  Pelvic floor musculature: Right levator non-tender, Right obturator non-tender, Left levator non-tender, Left obturator non-tender  POP-Q:   POP-Q  -2.5                                            Aa   -2.5  Ba  -8                                              C   4                                            Gh  4                                            Pb  10                                            tvl   -0.5                                            Ap  -0.5                                            Bp  -9                                              D      Rectal Exam:  Normal sphincter tone, small distal rectocele, enterocoele not present, no rectal masses, no sign of dyssynergia when asking the patient to bear down.  Post-Void Residual (PVR) by Bladder Scan: In order to evaluate bladder emptying, we discussed obtaining a postvoid residual and patient agreed to this procedure.  Procedure: The ultrasound unit was placed on the patient's abdomen in the suprapubic region after the patient had voided.    Post Void Residual - 03/05/24 1327       Post Void Residual   Post Void Residual 13 mL           Laboratory Results: No results found for: COLORU, CLARITYU, GLUCOSEUR, BILIRUBINUR, KETONESU, SPECGRAV, RBCUR, PHUR, PROTEINUR, UROBILINOGEN, LEUKOCYTESUR  No results found for: CREATININE  No results found for: HGBA1C  Lab Results  Component Value Date   HGB 11.6 (L) 07/10/2016     ASSESSMENT AND PLAN Ms. Nunnelley is a 58 y.o. with:  1. Overactive bladder   2. SUI (stress urinary incontinence, female)   3. Prolapse of posterior vaginal wall   4. Chronic idiopathic constipation     Overactive bladder Assessment & Plan: - We discussed the symptoms of overactive bladder (OAB), which include urinary urgency, urinary  frequency, nocturia, with or without urge incontinence.  While we do not know the exact etiology of OAB, several treatment options exist. We discussed management including behavioral therapy (decreasing bladder irritants, urge suppression strategies, timed voids, bladder retraining), physical therapy, medication; for refractory cases posterior tibial nerve stimulation, sacral neuromodulation, and intravesical botulinum toxin  injection.  - She prefers to avoid medication and is interested in intravesical botox. Reviewed the risks, benefits and alternatives of treatment including but not limited to infection, need for self-catheterization and need for repeat therapy.  We discussed that there is a 5-15% chance of needing to catheterize with Botox and that this usually resolves in a few months; however can persist for longer periods of time.  Typically Botox injections would need to be repeated every 3-12 months since this is not a permanent therapy.  - Will attempt insurance approval for botox   SUI (stress urinary incontinence, female) Assessment & Plan: - For treatment of stress urinary incontinence,  non-surgical options include expectant management, weight loss, physical therapy, as well as a pessary.  Surgical options include a midurethral sling, Burch urethropexy, and transurethral injection of a bulking agent. - She prefers urethral bulking. We discussed success rate of approximately 70-80% and possible need for second injection. We reviewed that this is not a permanent procedure and the Bulkamid does become less effective over time. Risks reviewed including injury to bladder or urethra, UTI, urinary retention and hematuria.  - Will have her undergo urodynamic testing prior to procedure to demonstrate leakage due to mixed incontinence symptoms   Prolapse of posterior vaginal wall Assessment & Plan: - Stage II Posterior prolapse - Not very symptomatic currently. We discussed avoidance of  constipation and straining to prevent further progression.    Chronic idiopathic constipation Assessment & Plan: - For constipation, we reviewed the importance of a better bowel regimen.  We also discussed the importance of avoiding chronic straining, as it can exacerbate her pelvic floor symptom/  -  We discussed initiating therapy with increasing fluid intake, fiber supplementation, stool softeners, and laxatives such as miralax daily.    Return for urodynamic testing.    Rosaline LOISE Caper, MD

## 2024-03-05 NOTE — Assessment & Plan Note (Signed)
-   Stage II Posterior prolapse - Not very symptomatic currently. We discussed avoidance of constipation and straining to prevent further progression.

## 2024-03-05 NOTE — Patient Instructions (Addendum)
 Today we talked about ways to manage bladder urgency such as altering your diet to avoid irritative beverages and foods (bladder diet) as well as attempting to decrease stress and other exacerbating factors.    The Most Bothersome Foods* The Least Bothersome Foods*  Coffee - Regular & Decaf Tea - caffeinated Carbonated beverages - cola, non-colas, diet & caffeine-free Alcohols - Beer, Red Wine, White Wine, [address] - Grapefruit, [place], Orange, [employer] - Cranberry, Grapefruit, Orange, Pineapple Vegetables - Tomato & Tomato Products Flavor Enhancers - Hot peppers, Spicy foods, Chili, Horseradish, Vinegar, Monosodium glutamate (MSG) Artificial Sweeteners - NutraSweet, Sweet 'N Low, Equal (sweetener), Saccharin Ethnic foods - [place], [place], [place] food [employer] - low-fat & whole Fruits - Bananas, Blueberries, Honeydew melon, Pears, Raisins, Watermelon Vegetables - Broccoli, [address] Sprouts, [place], Carrots, Cauliflower, [place], Cucumber, Mushrooms, Peas, Radishes, Squash, Zucchini, White potatoes, Sweet potatoes & yams Poultry - Chicken, Eggs, [place], [employer] - Beef, Diplomatic Services operational officer, Lamb Seafood - Shrimp, Pearl fish, Salmon Grains - Oat, Rice Snacks - Pretzels, Popcorn  *Mitch ALF et al. Diet and its role in interstitial cystitis/bladder pain syndrome (IC/BPS) and comorbid conditions. [employer]. BJU Int. 2012 Jan 11.   Constipation: Our goal is to achieve formed bowel movements daily or every-other-day.  You may need to try different combinations of the following options to find what works best for you - everybody's body works differently so feel free to adjust the dosages as needed.  Some options to help maintain bowel health include:  Dietary changes (more leafy greens, vegetables and fruits; less processed foods) Fiber supplementation (Benefiber, FiberCon, Metamucil or Psyllium). Start slow and increase gradually to full dose. Over-the-counter agents such as: stool  softeners (Docusate or Colace) and/or laxatives (Miralax, milk of magnesia) . Recommend miralax daily  Power Pudding is a natural mixture that may help your constipation.  To make blend 1 cup applesauce, 1 cup wheat bran, and 3/4 cup prune juice, refrigerate and then take 1 tablespoon daily with a large glass of water as needed.  URODYNAMICS (UDS) TEST INFORMATION  IMPORTANT: Please try to arrive with a comfortably full bladder!    What is UDS? Urodynamics is a bladder test used to evaluate how your bladder and urethra (tube you urinate out of) work to help find out the cause of your bladder symptoms and evaluate your bladder function in order to make the best treatment plan for you.   What to expect? A nurse will perform the test and will be with you during the entire exam. First we will have to empty your bladder on a special toilet.  After you have emptied your bladder, very small catheters (plastic tubing) will be placed into your bladder and into your vagina (or rectum). These special small catheters measure pressure to help measure your bladder function.  Your bladder will be gently filled with water and you will be asked to cough and strain at several different points during the test.   You will then be asked to empty your bladder in the special toilet with the catheters in place. Most patients can urinate (pee) easily with the catheters in place since the catheters are so small. In total this procedure lasts about 45 minutes to 1 hour.  After your test is completed, you will return (or possibly be seen the same day) to review the results, talk about treatment options and make a plan moving forward.

## 2024-03-06 LAB — POCT URINALYSIS DIP (CLINITEK)
Bilirubin, UA: NEGATIVE
Glucose, UA: NEGATIVE mg/dL
Ketones, POC UA: NEGATIVE mg/dL
Leukocytes, UA: NEGATIVE
Nitrite, UA: NEGATIVE
POC PROTEIN,UA: NEGATIVE
Spec Grav, UA: <=1.005 — AB (ref 1.010–1.025)
Urobilinogen, UA: 0.2 U/dL
pH, UA: 5.5 (ref 5.0–8.0)

## 2024-03-06 NOTE — Addendum Note (Signed)
 Addended by: Loeta Herst N on: 03/06/2024 08:34 AM   Modules accepted: Orders

## 2024-03-07 ENCOUNTER — Encounter: Payer: Self-pay | Admitting: Obstetrics and Gynecology

## 2024-04-23 ENCOUNTER — Encounter: Admitting: Obstetrics and Gynecology

## 2024-06-11 ENCOUNTER — Encounter: Admitting: Obstetrics and Gynecology

## 2024-06-11 ENCOUNTER — Ambulatory Visit: Admitting: Obstetrics and Gynecology

## 2024-06-26 ENCOUNTER — Telehealth: Payer: Self-pay | Admitting: Obstetrics and Gynecology

## 2024-06-26 NOTE — Telephone Encounter (Signed)
 LVM for patient to call back to schedule and OV for [facility].

## 2024-07-01 ENCOUNTER — Encounter: Payer: Self-pay | Admitting: Obstetrics and Gynecology

## 2024-07-01 ENCOUNTER — Ambulatory Visit: Admitting: Obstetrics and Gynecology

## 2024-07-01 VITALS — BP 145/86 | HR 57

## 2024-07-01 DIAGNOSIS — N3281 Overactive bladder: Secondary | ICD-10-CM

## 2024-07-01 DIAGNOSIS — N393 Stress incontinence (female) (male): Secondary | ICD-10-CM | POA: Diagnosis not present

## 2024-07-31 ENCOUNTER — Ambulatory Visit: Admitting: Obstetrics and Gynecology

## 2024-08-29 ENCOUNTER — Ambulatory Visit: Admitting: Obstetrics and Gynecology
